# Patient Record
Sex: Female | Born: 1970
Health system: Southern US, Community
[De-identification: ages and names within clinical notes are randomized; demographics above are authoritative.]

## PROBLEM LIST (undated history)

## (undated) DIAGNOSIS — D649 Anemia, unspecified: Secondary | ICD-10-CM

## (undated) DIAGNOSIS — R7989 Other specified abnormal findings of blood chemistry: Secondary | ICD-10-CM

## (undated) DIAGNOSIS — J189 Pneumonia, unspecified organism: Secondary | ICD-10-CM

## (undated) DIAGNOSIS — N979 Female infertility, unspecified: Secondary | ICD-10-CM

## (undated) DIAGNOSIS — O3410 Maternal care for benign tumor of corpus uteri, unspecified trimester: Secondary | ICD-10-CM

## (undated) DIAGNOSIS — D259 Leiomyoma of uterus, unspecified: Secondary | ICD-10-CM

## (undated) DIAGNOSIS — Z9884 Bariatric surgery status: Secondary | ICD-10-CM

## (undated) HISTORY — PX: FINGER SURGERY: SHX640

## (undated) HISTORY — PX: TONSILECTOMY, ADENOIDECTOMY, BILATERAL MYRINGOTOMY AND TUBES: SHX2538

## (undated) HISTORY — DX: Other specified abnormal findings of blood chemistry: R79.89

## (undated) HISTORY — PX: TONSILLECTOMY: SUR1361

## (undated) HISTORY — PX: FOOT SURGERY: SHX648

## (undated) HISTORY — PX: DILATION AND CURETTAGE OF UTERUS: SHX78

---

## 1998-04-08 ENCOUNTER — Emergency Department (HOSPITAL_COMMUNITY): Admission: EM | Admit: 1998-04-08 | Discharge: 1998-04-08 | Payer: Self-pay | Admitting: Internal Medicine

## 1998-04-20 ENCOUNTER — Emergency Department (HOSPITAL_COMMUNITY): Admission: EM | Admit: 1998-04-20 | Discharge: 1998-04-20 | Payer: Self-pay

## 1998-11-01 ENCOUNTER — Emergency Department (HOSPITAL_COMMUNITY): Admission: EM | Admit: 1998-11-01 | Discharge: 1998-11-01 | Payer: Self-pay | Admitting: Emergency Medicine

## 1999-04-20 ENCOUNTER — Emergency Department (HOSPITAL_COMMUNITY): Admission: EM | Admit: 1999-04-20 | Discharge: 1999-04-20 | Payer: Self-pay | Admitting: Emergency Medicine

## 1999-08-05 ENCOUNTER — Emergency Department (HOSPITAL_COMMUNITY): Admission: EM | Admit: 1999-08-05 | Discharge: 1999-08-05 | Payer: Self-pay | Admitting: Emergency Medicine

## 1999-11-10 ENCOUNTER — Emergency Department (HOSPITAL_COMMUNITY): Admission: EM | Admit: 1999-11-10 | Discharge: 1999-11-10 | Payer: Self-pay | Admitting: Emergency Medicine

## 1999-11-12 ENCOUNTER — Emergency Department (HOSPITAL_COMMUNITY): Admission: EM | Admit: 1999-11-12 | Discharge: 1999-11-12 | Payer: Self-pay | Admitting: Emergency Medicine

## 2000-02-17 ENCOUNTER — Ambulatory Visit (HOSPITAL_BASED_OUTPATIENT_CLINIC_OR_DEPARTMENT_OTHER): Admission: RE | Admit: 2000-02-17 | Discharge: 2000-02-18 | Payer: Self-pay | Admitting: Otolaryngology

## 2000-02-17 ENCOUNTER — Encounter (INDEPENDENT_AMBULATORY_CARE_PROVIDER_SITE_OTHER): Payer: Self-pay | Admitting: Specialist

## 2000-06-29 ENCOUNTER — Encounter: Payer: Self-pay | Admitting: Emergency Medicine

## 2000-06-29 ENCOUNTER — Emergency Department (HOSPITAL_COMMUNITY): Admission: EM | Admit: 2000-06-29 | Discharge: 2000-06-29 | Payer: Self-pay | Admitting: Emergency Medicine

## 2000-07-11 ENCOUNTER — Emergency Department (HOSPITAL_COMMUNITY): Admission: EM | Admit: 2000-07-11 | Discharge: 2000-07-11 | Payer: Self-pay | Admitting: Emergency Medicine

## 2000-07-14 ENCOUNTER — Ambulatory Visit (HOSPITAL_BASED_OUTPATIENT_CLINIC_OR_DEPARTMENT_OTHER): Admission: RE | Admit: 2000-07-14 | Discharge: 2000-07-14 | Payer: Self-pay | Admitting: Orthopedic Surgery

## 2000-09-09 ENCOUNTER — Other Ambulatory Visit: Admission: RE | Admit: 2000-09-09 | Discharge: 2000-09-09 | Payer: Self-pay | Admitting: Gynecology

## 2001-07-25 ENCOUNTER — Encounter: Payer: Self-pay | Admitting: Emergency Medicine

## 2001-07-25 ENCOUNTER — Emergency Department (HOSPITAL_COMMUNITY): Admission: EM | Admit: 2001-07-25 | Discharge: 2001-07-25 | Payer: Self-pay | Admitting: Emergency Medicine

## 2001-12-14 ENCOUNTER — Encounter: Payer: Self-pay | Admitting: Emergency Medicine

## 2001-12-14 ENCOUNTER — Emergency Department (HOSPITAL_COMMUNITY): Admission: EM | Admit: 2001-12-14 | Discharge: 2001-12-14 | Payer: Self-pay | Admitting: *Deleted

## 2002-03-15 ENCOUNTER — Emergency Department (HOSPITAL_COMMUNITY): Admission: EM | Admit: 2002-03-15 | Discharge: 2002-03-15 | Payer: Self-pay | Admitting: Emergency Medicine

## 2002-03-15 ENCOUNTER — Encounter: Payer: Self-pay | Admitting: Emergency Medicine

## 2002-04-25 ENCOUNTER — Emergency Department (HOSPITAL_COMMUNITY): Admission: EM | Admit: 2002-04-25 | Discharge: 2002-04-25 | Payer: Self-pay | Admitting: Emergency Medicine

## 2002-04-25 ENCOUNTER — Encounter: Payer: Self-pay | Admitting: Emergency Medicine

## 2003-05-01 ENCOUNTER — Other Ambulatory Visit: Admission: RE | Admit: 2003-05-01 | Discharge: 2003-05-01 | Payer: Self-pay | Admitting: Gynecology

## 2003-09-26 ENCOUNTER — Emergency Department (HOSPITAL_COMMUNITY): Admission: EM | Admit: 2003-09-26 | Discharge: 2003-09-26 | Payer: Self-pay

## 2003-11-29 ENCOUNTER — Emergency Department (HOSPITAL_COMMUNITY): Admission: EM | Admit: 2003-11-29 | Discharge: 2003-11-29 | Payer: Self-pay | Admitting: Emergency Medicine

## 2004-01-04 ENCOUNTER — Emergency Department (HOSPITAL_COMMUNITY): Admission: EM | Admit: 2004-01-04 | Discharge: 2004-01-05 | Payer: Self-pay | Admitting: Emergency Medicine

## 2004-12-06 ENCOUNTER — Emergency Department (HOSPITAL_COMMUNITY): Admission: EM | Admit: 2004-12-06 | Discharge: 2004-12-06 | Payer: Self-pay | Admitting: Emergency Medicine

## 2005-02-04 ENCOUNTER — Other Ambulatory Visit: Admission: RE | Admit: 2005-02-04 | Discharge: 2005-02-04 | Payer: Self-pay | Admitting: Gynecology

## 2005-07-23 ENCOUNTER — Emergency Department (HOSPITAL_COMMUNITY): Admission: EM | Admit: 2005-07-23 | Discharge: 2005-07-23 | Payer: Self-pay | Admitting: Emergency Medicine

## 2005-07-26 ENCOUNTER — Emergency Department (HOSPITAL_COMMUNITY): Admission: EM | Admit: 2005-07-26 | Discharge: 2005-07-26 | Payer: Self-pay | Admitting: Emergency Medicine

## 2006-07-29 ENCOUNTER — Other Ambulatory Visit: Admission: RE | Admit: 2006-07-29 | Discharge: 2006-07-29 | Payer: Self-pay | Admitting: Gynecology

## 2006-09-23 ENCOUNTER — Encounter: Admission: RE | Admit: 2006-09-23 | Discharge: 2006-09-23 | Payer: Self-pay | Admitting: Gynecology

## 2006-09-27 ENCOUNTER — Ambulatory Visit (HOSPITAL_COMMUNITY): Admission: RE | Admit: 2006-09-27 | Discharge: 2006-09-27 | Payer: Self-pay | Admitting: Surgery

## 2006-09-29 ENCOUNTER — Ambulatory Visit (HOSPITAL_COMMUNITY): Admission: RE | Admit: 2006-09-29 | Discharge: 2006-09-29 | Payer: Self-pay | Admitting: Surgery

## 2006-10-05 ENCOUNTER — Encounter: Admission: RE | Admit: 2006-10-05 | Discharge: 2007-01-03 | Payer: Self-pay | Admitting: Surgery

## 2007-03-04 ENCOUNTER — Encounter (INDEPENDENT_AMBULATORY_CARE_PROVIDER_SITE_OTHER): Payer: Self-pay | Admitting: Gynecology

## 2007-03-04 ENCOUNTER — Ambulatory Visit (HOSPITAL_BASED_OUTPATIENT_CLINIC_OR_DEPARTMENT_OTHER): Admission: RE | Admit: 2007-03-04 | Discharge: 2007-03-04 | Payer: Self-pay | Admitting: Gynecology

## 2007-07-11 ENCOUNTER — Encounter: Admission: RE | Admit: 2007-07-11 | Discharge: 2007-10-09 | Payer: Self-pay | Admitting: Surgery

## 2007-07-21 HISTORY — PX: MYOMECTOMY ABDOMINAL APPROACH: SUR870

## 2007-07-24 DIAGNOSIS — Z9884 Bariatric surgery status: Secondary | ICD-10-CM | POA: Insufficient documentation

## 2007-07-24 HISTORY — PX: LAPAROSCOPIC GASTRIC BANDING: SHX1100

## 2007-07-24 HISTORY — DX: Bariatric surgery status: Z98.84

## 2007-07-25 ENCOUNTER — Ambulatory Visit (HOSPITAL_COMMUNITY): Admission: RE | Admit: 2007-07-25 | Discharge: 2007-07-26 | Payer: Self-pay | Admitting: Surgery

## 2007-11-10 ENCOUNTER — Encounter: Admission: RE | Admit: 2007-11-10 | Discharge: 2008-02-08 | Payer: Self-pay | Admitting: Surgery

## 2007-12-01 ENCOUNTER — Encounter: Admission: RE | Admit: 2007-12-01 | Discharge: 2007-12-01 | Payer: Self-pay | Admitting: Gynecology

## 2007-12-06 ENCOUNTER — Emergency Department (HOSPITAL_COMMUNITY): Admission: EM | Admit: 2007-12-06 | Discharge: 2007-12-06 | Payer: Self-pay | Admitting: Emergency Medicine

## 2008-05-04 ENCOUNTER — Encounter: Admission: RE | Admit: 2008-05-04 | Discharge: 2008-05-04 | Payer: Self-pay | Admitting: Surgery

## 2009-05-31 IMAGING — MG MM DIGITAL SCREENING BILAT W/ CAD
8 series · 8 of 8 positions shown · non-contrast
Comparison: none

DG SCREEN MAMMOGRAM BILATERAL
Bilateral CC and MLO view(s) were taken.

DIGITAL SCREENING MAMMOGRAM WITH CAD:
The breast tissue is almost entirely fatty.  No masses or malignant type calcifications are 
identified.  Compared with prior studies.

[R CC (1 of 2)]
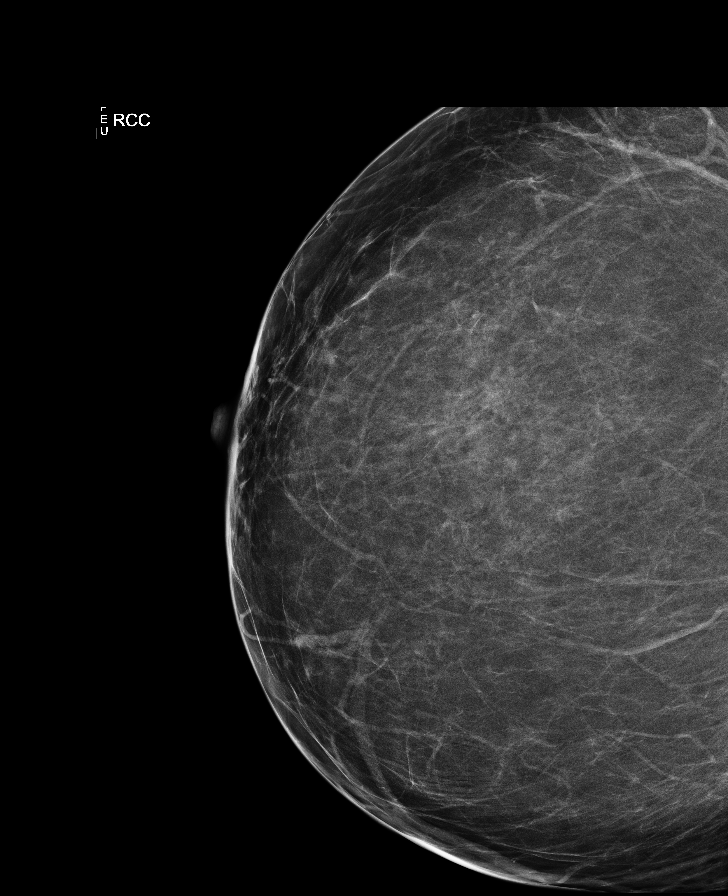

[L CC (1 of 2)]
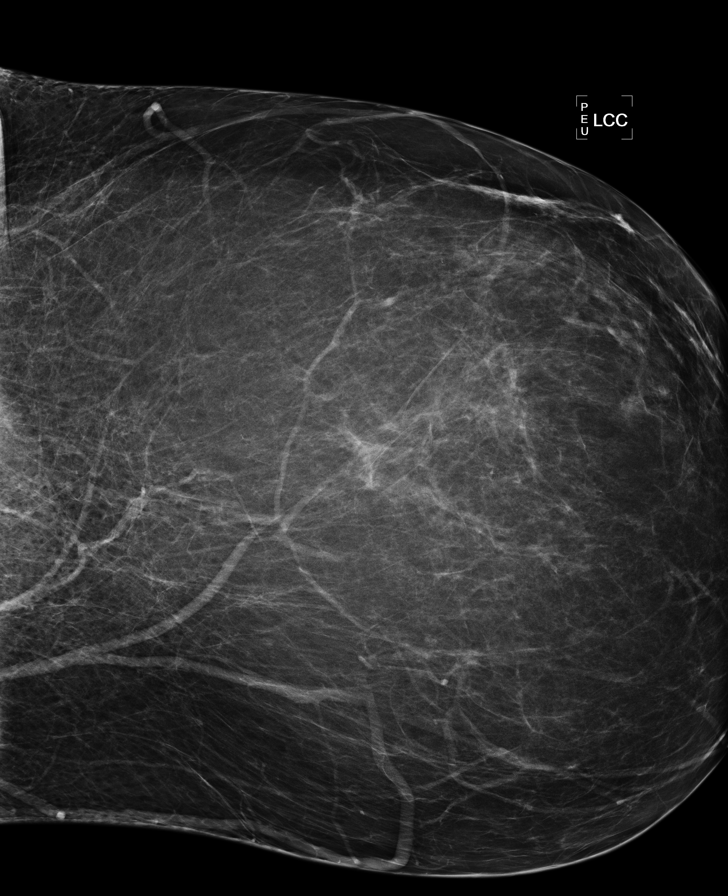

[L MLO (1 of 2)]
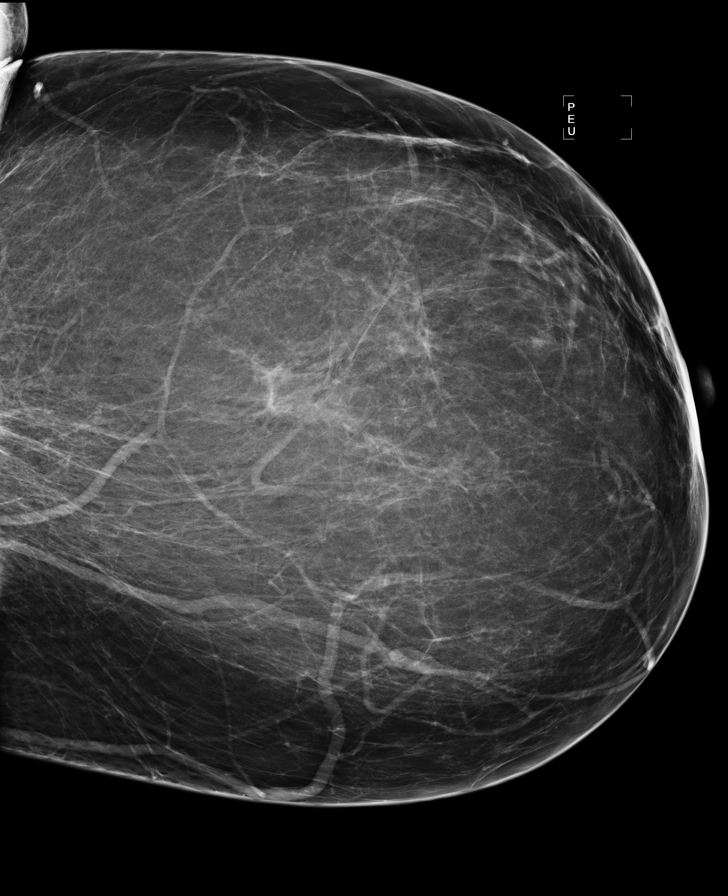

[R MLO (1 of 2)]
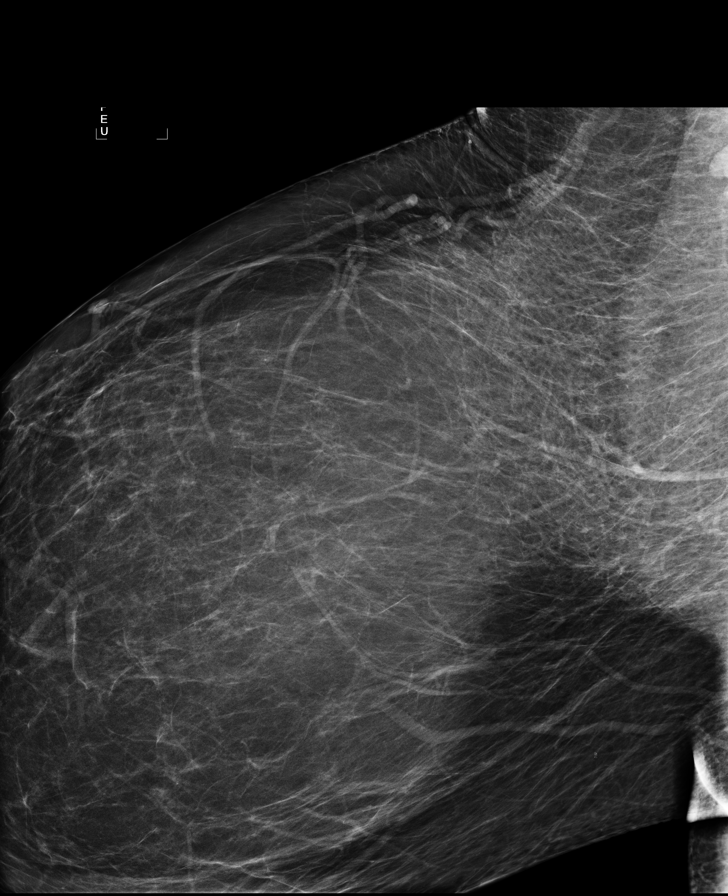

[R MLO (2 of 2)]
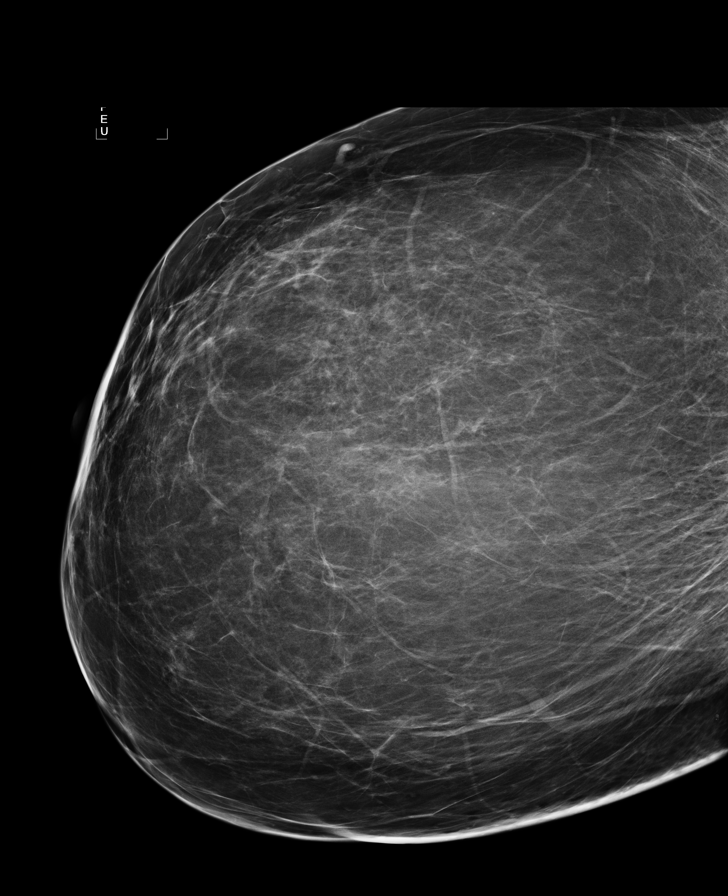

[R CC (2 of 2)]
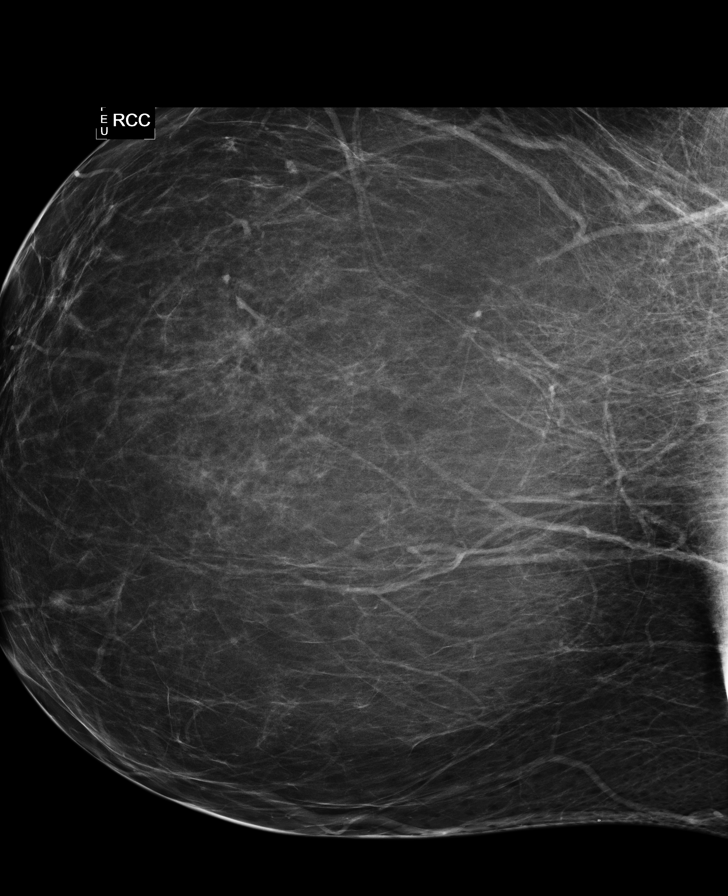

[L CC (2 of 2)]
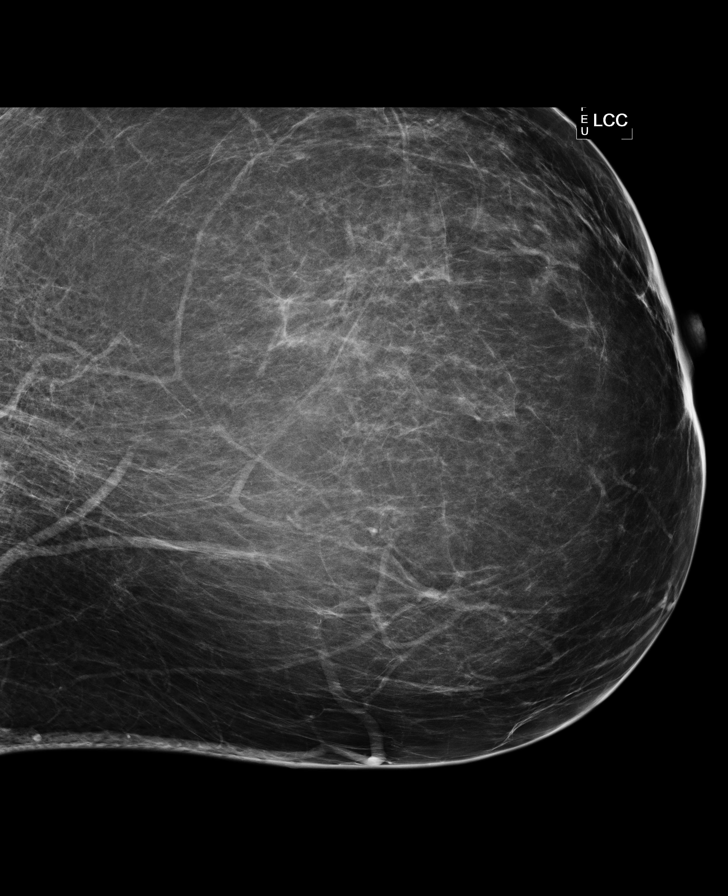

[L MLO (2 of 2)]
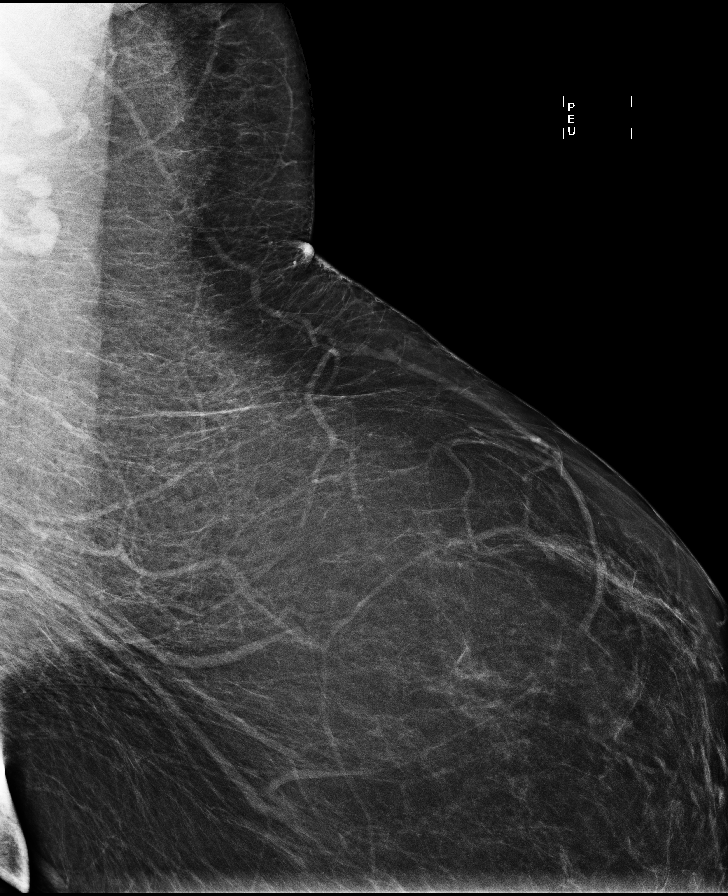

[8 of 8 positions shown; findings below may reference images not displayed]

IMPRESSION: No specific mammographic evidence of malignancy.  Next screening mammogram is recommended in one 
year.

ASSESSMENT: Negative - BI-RADS 1

Screening mammogram in 1 year.
ANALYZED BY COMPUTER AIDED DETECTION. , THIS PROCEDURE WAS A DIGITAL MAMMOGRAM.

## 2009-07-20 HISTORY — PX: OTHER SURGICAL HISTORY: SHX169

## 2010-02-28 ENCOUNTER — Encounter: Admission: RE | Admit: 2010-02-28 | Discharge: 2010-02-28 | Payer: Self-pay | Admitting: Gynecology

## 2010-08-10 ENCOUNTER — Encounter: Payer: Self-pay | Admitting: Gynecology

## 2010-12-02 NOTE — Op Note (Signed)
Michelle Serrano, Michelle Serrano                ACCOUNT NO.:  [id number]   MEDICAL RECORD NO.:  [id number]          PATIENT TYPE:  AMB   LOCATION:  [facility]                         FACILITY:  [facility]   PHYSICIAN:  Gretta Cool, M.D. DATE OF BIRTH:  11/21/1970   DATE OF PROCEDURE:  DATE OF DISCHARGE:                               OPERATIVE REPORT   PREOPERATIVE DIAGNOSIS:  Bilateral complex adnexal structures suspicious  of endometriosis with elevation of CA125, minimal, versus bilateral tubo-  ovarian complexes versus possible ovarian neoplasia.   POSTOPERATIVE DIAGNOSIS:  Extreme adhesion of the bowel, uterus and  adnexal structures probably secondary to previous pelvic inflammatory  disease with bilateral tubo-ovarian complexes, difficult to rule out  ovarian neoplasia, benign.   PROCEDURE:  1. Diagnostic laparoscopy.  2. Pelvic irrigation.  3. Attempted lysis of adhesions and definitive evaluation abandoned      because of the extent of adhesions, lack of bowel prep and so      forth.   SURGEON:  Beather Arbour, MD   ANESTHESIA:  General orotracheal.   BRIEF HISTORY:  Michelle Serrano is a 40 year old nulligravida with extreme  morbid obesity with BMI approaching 50.  She has a long history of  infertility and unprotected intercourse with her previous husband and  present husband; he has had a negative workup.  She has had CT and  repeated ultrasound evaluation of complex adnexal structures, both left  and right.  The complexity varies back and forth from left to right with  evidence of complex suspicious of endometriosis versus tubo-ovarian  complex.  There is a remote possibility of ovarian neoplasia.  She now  presents for laparoscopy to rule out ovarian neoplasia and for  definitive treatment of ovarian endometriosis if present.  She has had  minimal elevations of CA125 ranging from 35-43.  She is now admitted for  diagnostic laparoscopy evaluation.   DESCRIPTION OF PROCEDURE:  Under  excellent general orotracheal  anesthesia, with the patient prepped and draped in Allen stirrups, with  Hulka tenacula applied to her cervix and bladder drained, a subumbilical  incision was made and the Optiview system used to enter the abdominal  cavity so as to prevent injury.  The bariatric ports were used.  After  the peritoneal cavity was entered and proper placement assured, the  peritoneal cavity was filled with carbon dioxide.  The cavity was then  examined and photographed.  A large complex right adnexal structure was  identified that appeared to be a tubo-ovarian complex densely adherent  to the back of the uterus and to the lateral pelvic wall; it also was  adherent to the cul-de-sac peritoneum.  The adhesions seemed to be  limited to the large bowel; there was no evidence of small bowel  adhesion.  The large bowel appeared to be adherent over the left ovary  and to the right posterior aspect of the uterus and to the right tubo-  ovarian complex.  The left adnexal structures were never visualized.  Attempt was made to mobilize the uterus and adnexal structures and the  bowel from these structures, but  the adhesions were felt to be  significantly dense of such intensity that there was considerable risk  of injury.  At this point, with no evidence of endometriosis and strong  suspicion confirmed of pelvic inflammatory  process, decision was made to terminate the outpatient procedure and  return for definitive therapy at a later time with bowel prep and  adequate perforation in the inpatient setting.  With a length of time of  followup, I think the possibility of a neoplasm or anaplastic process is  virtually nonexistent.           ______________________________  Gretta Cool, M.D.     CWL/MEDQ  D:  03/04/2007  T:  03/05/2007  Job:  [id number]

## 2010-12-02 NOTE — Op Note (Signed)
Michelle Serrano, Michelle Serrano                ACCOUNT NO.:  [id number]   MEDICAL RECORD NO.:  [id number]          PATIENT TYPE:  AMB   LOCATION:  DAY                          FACILITY:  [facility]   PHYSICIAN:  Sandria Bales. Ezzard Standing, M.D.  DATE OF BIRTH:  03-May-1971   DATE OF PROCEDURE:  07/25/2007  DATE OF DISCHARGE:                               OPERATIVE REPORT   PREOPERATIVE DIAGNOSIS:  Morbid obesity with a weight of 313 and body  mass index of 49.2.   POSTOPERATIVE DIAGNOSIS:  Morbid obesity with a weight of 313 and body  mass index of 49.2.   PROCEDURE:  Laparoscopic band, AP standard band.   INDICATIONS FOR PROCEDURE:  Michelle Serrano is a 40 year old black female who  is a patient of Dr. Esperanza Richters who has been through our preoperative  bariatric program for morbid obesity.  Her weight is 313, her BMI 49.23,  and she is interested a laparoscopic band procedure.   The indications and potential complications were explained to the  patient.  The potential complications include, but are not limited to,  bleeding, infection, bowel injury, slippage of the band, erosion of the  band, and long-term nutritional consequences.   The patient now comes to the operating room for surgery.   OPERATIVE NOTE:  A timeout was held at the initiation of the procedure,  identifying the patient and procedure.  She was given a gram of Ancef at  the initiation of the procedure.  Her abdomen was prepped with Betadine  solution, sterilely draped, and PAS stockings were in place.   I accessed her abdominal cavity with an 11 mm Ethicon OptiView in the  left upper quadrant and accessed the abdominal cavity.  I then placed  five additional trocars, a 5 mm subxiphoid trocar, a 15 mm right  subcostal, an 11 mm right paramedian, an 11 mm left paramedian, and a 5  mm left lateral subcostal trocar.   Abdominal exploration carried out, identifying right and left lobes of  the liver unremarkable.  The stomach was unremarkable.   The bowel of  what I could see was unremarkable, much of this covered with omental  fat.   I then turn my attention to the proximal stomach.  I placed the  [facility] liver retractor under the lateral segment of the left lobe of  the liver, identified the angle of Hiss, and made an incision on the  left side of the esophagogastric junction.  I then made an incision  through the gastrohepatic ligament and identified the right crus.   I then passed the finger dissector behind the esophagogastric junction.   I then used the AP standard lap band and placed this around the proximal  stomach.  Anesthesia then passed a sizing balloon down into the stomach  and cinched the lap band down over this sizer, and it was not too tight.   I then imbricated the stomach over the band using 2-0 Ethibond sutures  sewing the stomach  to stomach.  I used three separate sutures and used  tie-knots on the sutures.  The band  then lay up on a a good angle.  It  was imbricated appropriately without too much tension.  Photos were  taken of the band.  The tubing the goes to the band was then brought out  through the right lateral trocar.  The liver retractor was removed and  the abdomen desufflated.  The trocars were removed in turn.   I then extended my incision in the right mid abdomen.  I attached the  tubing to the band and sewed the band in place with 0-Prolene suture.  The band lay flat.  The tubing went straight into the abdominal cavity.  I then irrigated the wound, closed the subcutaneous tissue with 2-0  Vicryl suture.  The skin was closed with 5-0 Vicryl and 5-0 Monocryl  suture, painted with tincture of Benzoin and Steri-Strips.   The patient tolerated the procedure well and was transported to the  recovery room in good condition.  Sponge and needle counts were correct  at the end of the case.      Sandria Bales. Ezzard Standing, M.D.  Electronically Signed     DHN/MEDQ  D:  07/25/2007  T:  07/25/2007   Job:  [id number]   cc:   Michelle Serrano, M.D.  Fax: [phone]

## 2010-12-05 NOTE — Op Note (Signed)
[place]. [facility]  Patient:    Michelle Serrano                     MRN: [id number] Proc. Date: 02/17/00 Adm. Date:  [id number] Attending:  Fernande Boyden CC:         Zenaida Niece, M.D.   Operative Report  PREOPERATIVE DIAGNOSIS:  Recurrent streptococcal tonsillitis.  POSTOPERATIVE DIAGNOSIS:  Recurrent streptococcal tonsillitis.  PROCEDURE PERFORMED:  Tonsillectomy.  SURGEON:  Gloris Manchester. Lazarus Salines, M.D.  ANESTHESIA:  General orotracheal.  BLOOD LOSS:  Minimal.  COMPLICATIONS:  None.  FINDINGS:  Two plus embedded, heavily fibrosed tonsils.  Minimal residual adenoids.  Normal soft palpate.  PROCEDURE:  With the patient in a comfort supine position, general orotracheal anesthesia was induced without difficulty.  At an appropriate level, the table was turned 90 degrees with patient placed in Trendelenburg.  A clean preparation and draping was accomplished.  Taking care to protect lips, teeth and endotracheal tube, the Crowe-Davis mouthgag was introduced, expanded for visualization, and suspended from the Mayo stand in the standard fashion.  The findings were as described above.  Palate retractor and mirror were used to visualize the nasopharynx with the findings as described above.  Finally, nasal speculum was used to examine the anterior nose with findings of congested turbinates.  Election was made to proceed with tonsillectomy only.  Xylocaine 0.5% with 1:100,000 epinephrine, 8 cc total was infiltrated into the peritonsillar planes on both sides for intraoperative hemostasis.  Several minutes were allowed for this to take affect.  Beginning on the left side, the tonsil was grasped and retracted medially. The mucosa overlying the anterior and superior poles were coagulated and then cut down to the capsule of the tonsil.  Using the cautery tip as a blunt dissector, but mostly lysing fibrous bands and coagulating crossing vessels  as identified, the tonsil was dissected free with muscular fossa from superiorly downward.  The tonsil was removed in its entirety as determined by examination of both tonsil and fossa.  A small additional quantity of cautery rendered the fossa hemostatic.  After completing the left tonsillectomy, the right side was done in an identical fashion.  After rendering both tonsil fossae hemostatic, the mouthgag was relaxed for several minutes.  Upon reexpansion, hemostasis was persistent.  At this point, the procedure was completed.  The mouthgag was relaxed and removed.  The dental status was intact.  Patient was returned to anesthesia, awakened, extubated and transferred to recovery in stable condition.  COMMENT:  A 40 year old black female with a history of multiple episodes of recurrent streptococcal tonsillitis was the indication for todays procedure. Anticipated routine postoperative recovery with attention to analgesia, antibiosis, hydration, and observation for bleeding, emesis, or airway compromise. DD:  02/17/00 TD:  02/17/00 Job: [id number]

## 2010-12-05 NOTE — Op Note (Signed)
[place]  Patient:    Michelle Serrano, Michelle Serrano                    MRN: [id number] Proc. Date: 07/14/00 Adm. Date:  [id number] Disc. Date: [id number] Attending:  Harrel Carina CC:         Artist Pais Weingold, M.D. x 2                           Operative Report  PREOPERATIVE DIAGNOSIS:  Displaced right fifth proximal phalangeal fracture.  POSTOPERATIVE DIAGNOSIS:  Displaced right fifth proximal phalangeal fracture.  PROCEDURE:  Open reduction and internal fixation of above fracture using minifragment lag screws 1.5 mm x 8 x 8 x 9.  SURGEON:  Artist Pais. Mina Marble, M.D.  ASSISTANT:  Junius Roads. Ireton, P.A.C.  ANESTHESIA:  General anesthesia.  TOURNIQUET TIME:  45 minutes.  COMPLICATIONS:  None.  DRAINS:  None.  DESCRIPTION OF PROCEDURE:  The patient was taken to the operating room after the induction of adequate general anesthesia.  The right upper extremity was prepped and draped in the usual sterile fashion.  An Esmarch was used to exsanguinate the limb.  Tourniquet was inflated to 250 mmHg.  At this point in time a C-shaped incision was made over the dorsal aspect of the right fifth digit and a large radial based flap was elevated.  Once this was done, the extensor mechanism overlying the proximal phalanx was identified.  It was split with a 15 blade longitudinally, thus exposing the underlying periostium. The periostium was split longitudinally thus exposing a long spiral oblique fracture of the proximal phalanx which was displaced and angulated.  The fracture fragments were manipulated.  The fracture was opened.  Clot and nonviable material was debrided using a half curet and irrigation.  Once this was done, reduction was performed.  Provisional fixation was carried out using a 4/5 K-wire and intraoperative x-ray showed good alignment in both the AP and lateral plane.  Once this was done, three minifragment screws were used to fix the  fracture.  Two were 9 mm and one 8 mm x 1.5 mm which were screw tapped in place in lag fashion sequentially.  Intraoperative x-ray showed good reduction in both AP, lateral, and oblique views.  The wound was then thoroughly irrigated.  The periostium was closed using 4-0 Vicryl followed by repair of the tendon using a running and locking 4-0 Mersilene epitendoness stitch followed by closure of the skin with 5-0 nylon.  The patient had a Marcaine block for postoperative pain relief and was dressed in Xeroform, 4 x 4s, fluffs, and a functional splint.  The patient tolerated the procedure well and went to the recovery room in stable condition. DD:  07/14/00 TD:  07/14/00 Job: [id number]

## 2010-12-22 ENCOUNTER — Emergency Department (HOSPITAL_BASED_OUTPATIENT_CLINIC_OR_DEPARTMENT_OTHER)
Admission: EM | Admit: 2010-12-22 | Discharge: 2010-12-22 | Disposition: A | Discharge status: Home / Self Care | Payer: BC Managed Care – PPO | Attending: Emergency Medicine | Admitting: Emergency Medicine

## 2010-12-22 DIAGNOSIS — J40 Bronchitis, not specified as acute or chronic: Secondary | ICD-10-CM | POA: Insufficient documentation

## 2010-12-22 DIAGNOSIS — R05 Cough: Secondary | ICD-10-CM | POA: Insufficient documentation

## 2010-12-22 DIAGNOSIS — R059 Cough, unspecified: Secondary | ICD-10-CM | POA: Insufficient documentation

## 2011-04-09 LAB — CBC
HCT: 30.7 — ABNORMAL LOW
MCV: 78

## 2011-04-09 LAB — DIFFERENTIAL
Basophils Absolute: 0
Basophils Relative: 0
Eosinophils Relative: 0
Lymphocytes Relative: 28
Lymphs Abs: 2.2
Neutro Abs: 5.3
Neutrophils Relative %: 66

## 2011-04-15 LAB — COMPREHENSIVE METABOLIC PANEL
Albumin: 3.1 — ABNORMAL LOW
BUN: 3 — ABNORMAL LOW
CO2: 25
Calcium: 8.5
Chloride: 101
GFR calc Af Amer: >60
Glucose, Bld: 103 — ABNORMAL HIGH
Potassium: 3.3 — ABNORMAL LOW
Total Protein: 6.8

## 2011-04-15 LAB — WET PREP, GENITAL
Clue Cells Wet Prep HPF POC: NONE SEEN
Trich, Wet Prep: NONE SEEN
Yeast Wet Prep HPF POC: NONE SEEN

## 2011-04-15 LAB — URINE MICROSCOPIC-ADD ON

## 2011-04-15 LAB — URINALYSIS, ROUTINE W REFLEX MICROSCOPIC
Glucose, UA: NEGATIVE
Nitrite: NEGATIVE
Protein, ur: NEGATIVE
Specific Gravity, Urine: 1.026

## 2011-04-15 LAB — DIFFERENTIAL
Basophils Absolute: 0
Lymphs Abs: 1.5
Monocytes Absolute: 0.5
Monocytes Relative: 7
Neutro Abs: 5.4
Neutrophils Relative %: 72

## 2011-04-15 LAB — CBC
MCHC: 33.1
RDW: 15

## 2011-04-15 LAB — POCT PREGNANCY, URINE: Operator id: 29727

## 2011-04-15 LAB — RPR: RPR Ser Ql: NONREACTIVE

## 2011-04-24 LAB — BASIC METABOLIC PANEL
BUN: 7
Calcium: 9.2
Creatinine, Ser: 0.6
GFR calc non Af Amer: >60
Potassium: 4.9

## 2011-04-24 LAB — HEMOGLOBIN AND HEMATOCRIT, BLOOD: Hemoglobin: 11.6 — ABNORMAL LOW

## 2011-10-07 ENCOUNTER — Encounter (HOSPITAL_COMMUNITY): Payer: Self-pay | Admitting: *Deleted

## 2011-10-07 ENCOUNTER — Inpatient Hospital Stay (HOSPITAL_COMMUNITY)
Admission: AD | Admit: 2011-10-07 | Discharge: 2011-10-07 | Disposition: A | Discharge status: Home / Self Care | Payer: BC Managed Care – PPO | Source: Ambulatory Visit | Attending: Obstetrics and Gynecology | Admitting: Obstetrics and Gynecology

## 2011-10-07 DIAGNOSIS — N949 Unspecified condition associated with female genital organs and menstrual cycle: Secondary | ICD-10-CM | POA: Insufficient documentation

## 2011-10-07 DIAGNOSIS — R1031 Right lower quadrant pain: Secondary | ICD-10-CM | POA: Insufficient documentation

## 2011-10-07 HISTORY — DX: Bariatric surgery status: Z98.84

## 2011-10-07 HISTORY — DX: Female infertility, unspecified: N97.9

## 2011-10-07 HISTORY — DX: Anemia, unspecified: D64.9

## 2011-10-07 HISTORY — DX: Leiomyoma of uterus, unspecified: O34.10

## 2011-10-07 HISTORY — DX: Leiomyoma of uterus, unspecified: D25.9

## 2011-10-07 LAB — URINALYSIS, ROUTINE W REFLEX MICROSCOPIC
Glucose, UA: NEGATIVE mg/dL
Hgb urine dipstick: NEGATIVE
Specific Gravity, Urine: >1.03 — ABNORMAL HIGH (ref 1.005–1.030)
pH: 5.5 (ref 5.0–8.0)

## 2011-10-07 MED ORDER — TRAMADOL HCL 50 MG PO TABS: 50.0000 mg | ORAL_TABLET | Freq: Four times a day (QID) | ORAL | Status: AC | PRN

## 2011-10-07 NOTE — MAU Note (Signed)
Pt LMP 3/14, G2 P0, having RLQ pain x 1 wk.  This am noticed slight swelling on the right side and a knot in the groin area on the right side.

## 2011-10-07 NOTE — MAU Provider Note (Signed)
  History   RLQ pain  X one week  CSN: [id number]  Arrival date and time: 10/07/11 8295   First Provider Initiated Contact with Patient 10/07/11 2111      Chief Complaint  Patient presents with  . Abdominal Pain   HPI  OB History    Grav Para Term Preterm Abortions TAB SAB Ect Mult Living   2    2  2    0      Past Medical History  Diagnosis Date  . Uterine fibroids affecting pregnancy   . LAP-BAND surgery status 07-24-2007  . Anemia   . Infertility, female   . Asthma     Past Surgical History  Procedure Date  . Scar tissue removal in uterus 2011  . Laparoscopic gastric banding 07-24-2007  . Finger surgery     rt little finger  . Tonsilectomy, adenoidectomy, bilateral myringotomy and tubes     Family History  Problem Relation Age of Onset  . Anesthesia problems Neg Hx     History  Substance Use Topics  . Smoking status: Never Smoker   . Smokeless tobacco: Never Used  . Alcohol Use: No    Allergies:  Allergies  Allergen Reactions  . Aspirin Hives  . Latex Hives  . Other Other (See Comments)    Patient has been allergy tested and there are too many food allergies for me to list She does have an Epipen  . Shellfish Allergy Hives    Prescriptions prior to admission  Medication Sig Dispense Refill  . fexofenadine (ALLEGRA) 60 MG tablet Take 60 mg by mouth daily.      Marland Kitchen levocetirizine (XYZAL) 2.5 MG/5ML solution Take 2.5 mg by mouth 2 (two) times daily.        ROS Physical Exam dictated   Blood pressure 145/86, pulse 82, temperature 98.5 F (36.9 C), temperature source Oral, resp. rate 16, height 5\' 7"  (1.702 m), weight 105.144 kg (231 lb 12.8 oz), last menstrual period 10/01/2011.  Physical Exam  MAU Course  Procedures  MDM na  Assessment and Plan  RLQ pain - nl exam DC home  Note dictated  Wyatt Thorstenson J 10/07/2011, 9:14 PM

## 2011-10-07 NOTE — Discharge Instructions (Signed)

## 2011-10-08 LAB — POCT PREGNANCY, URINE: Preg Test, Ur: NEGATIVE

## 2011-10-08 NOTE — H&P (Signed)
NAMERYLYNNE, SCHICKER                ACCOUNT NO.:  [id number]  MEDICAL RECORD NO.:  [id number]  LOCATION:  [facility]                          FACILITY:  [facility]  PHYSICIAN:  Lenoard Aden, M.D.DATE OF BIRTH:  [birth year]  DATE OF ADMISSION:  10/07/2011 DATE OF DISCHARGE:  10/07/2011                             HISTORY & PHYSICAL   CHIEF COMPLAINT:  Right lower quadrant pain times a week.  She is a 41 year old African American female, G2, P0, with a history of myomectomy, who now presents with right-sided pain times a week.  ALLERGIES:  She has allergies to LATEX.  MEDICATIONS:  Vitamins.  SOCIAL HISTORY:  Noncontributory.  FAMILY HISTORY:  Remarkable for diabetes and breast cancer.  SURGICAL HISTORY:  Remarkable for laparoscopy, laparoscopic myomectomy and lap band surgery.  PHYSICAL EXAMINATION:  GENERAL:  She is a well-developed, well-nourished white female, in no acute distress. HEENT:  Normal. NECK:  Supple.  Full range of motion. LUNGS:  Clear. HEART:  Regular rate and rhythm. ABDOMEN:  Soft and nontender. PELVIC:  An irregularly shaped uterus with no adnexal masses. EXTREMITIES:  There are no cords. NEUROLOGIC:  Nonfocal. SKIN:  Intact.  IMPRESSION:  Pelvic pain of unknown etiology.  PLAN:  Tramadol given, follow up in the office as needed.  Scheduled pelvic ultrasound as needed.     Lenoard Aden, M.D.     RJT/MEDQ  D:  10/07/2011  T:  10/08/2011  Job:  [id number]

## 2012-07-20 DIAGNOSIS — J189 Pneumonia, unspecified organism: Secondary | ICD-10-CM

## 2012-07-20 HISTORY — DX: Pneumonia, unspecified organism: J18.9

## 2013-07-06 ENCOUNTER — Encounter (HOSPITAL_BASED_OUTPATIENT_CLINIC_OR_DEPARTMENT_OTHER): Payer: Self-pay | Admitting: Emergency Medicine

## 2013-07-06 ENCOUNTER — Emergency Department (HOSPITAL_BASED_OUTPATIENT_CLINIC_OR_DEPARTMENT_OTHER)
Admission: EM | Admit: 2013-07-06 | Discharge: 2013-07-06 | Disposition: A | Discharge status: Home / Self Care | Payer: BC Managed Care – PPO | Attending: Emergency Medicine | Admitting: Emergency Medicine

## 2013-07-06 DIAGNOSIS — J45909 Unspecified asthma, uncomplicated: Secondary | ICD-10-CM | POA: Insufficient documentation

## 2013-07-06 DIAGNOSIS — Z91040 Latex allergy status: Secondary | ICD-10-CM | POA: Insufficient documentation

## 2013-07-06 DIAGNOSIS — R5381 Other malaise: Secondary | ICD-10-CM | POA: Insufficient documentation

## 2013-07-06 DIAGNOSIS — Z862 Personal history of diseases of the blood and blood-forming organs and certain disorders involving the immune mechanism: Secondary | ICD-10-CM | POA: Insufficient documentation

## 2013-07-06 DIAGNOSIS — Z79899 Other long term (current) drug therapy: Secondary | ICD-10-CM | POA: Insufficient documentation

## 2013-07-06 DIAGNOSIS — R6883 Chills (without fever): Secondary | ICD-10-CM | POA: Insufficient documentation

## 2013-07-06 DIAGNOSIS — Z8742 Personal history of other diseases of the female genital tract: Secondary | ICD-10-CM | POA: Insufficient documentation

## 2013-07-06 DIAGNOSIS — R491 Aphonia: Secondary | ICD-10-CM | POA: Insufficient documentation

## 2013-07-06 DIAGNOSIS — J329 Chronic sinusitis, unspecified: Secondary | ICD-10-CM | POA: Insufficient documentation

## 2013-07-06 MED ORDER — AMOXICILLIN 500 MG PO CAPS: 1000.0000 mg | ORAL_CAPSULE | Freq: Two times a day (BID) | ORAL | Status: DC

## 2013-07-06 MED ORDER — HYDROCOD POLST-CHLORPHEN POLST 10-8 MG/5ML PO LQCR: 5.0000 mL | Freq: Every evening | ORAL | Status: DC | PRN

## 2013-07-06 NOTE — ED Notes (Signed)
Pt amb to room 7 with quick steady gait smiling in nad. Pt reports cough and congestion x Sunday with subjective fevers at night.

## 2013-07-06 NOTE — ED Provider Notes (Signed)
CSN: [id number]     Arrival date & time 07/06/13  1304 History   First MD Initiated Contact with Patient 07/06/13 1329     Chief Complaint  Patient presents with  . Cough  . Nasal Congestion   (Consider location/radiation/quality/duration/timing/severity/associated sxs/prior Treatment) Patient is a 42 y.o. female presenting with cough. The history is provided by the patient. No language interpreter was used.  Cough Cough characteristics:  Productive Severity:  Moderate Duration:  4 days Associated symptoms: myalgias and rhinorrhea   Associated symptoms: no fever   Associated symptoms comment:  Cough, worse at night and sometimes productive, nasal congestion and loss of voice for 4 days. She has had chills without known or measured temperature. No N, V. She reports frontal headache, worse with cough. No sick contacts.   Past Medical History  Diagnosis Date  . Uterine fibroids affecting pregnancy   . LAP-BAND surgery status 07-24-2007  . Anemia   . Infertility, female   . Asthma    Past Surgical History  Procedure Laterality Date  . Scar tissue removal in uterus  2011  . Laparoscopic gastric banding  07-24-2007  . Finger surgery      rt little finger  . Tonsilectomy, adenoidectomy, bilateral myringotomy and tubes     Family History  Problem Relation Age of Onset  . Anesthesia problems Neg Hx    History  Substance Use Topics  . Smoking status: Never Smoker   . Smokeless tobacco: Never Used  . Alcohol Use: No   OB History   Grav Para Term Preterm Abortions TAB SAB Ect Mult Living   2    2  2    0     Review of Systems  Constitutional: Negative for fever.  HENT: Positive for congestion, rhinorrhea, sinus pressure and voice change.   Respiratory: Positive for cough.   Gastrointestinal: Negative for nausea, vomiting and abdominal pain.  Genitourinary: Negative for dysuria.  Musculoskeletal: Positive for myalgias.    Allergies  Aspirin; Latex; Other; and Shellfish  allergy  Home Medications   Current Outpatient Rx  Name  Route  Sig  Dispense  Refill  . fexofenadine (ALLEGRA) 60 MG tablet   Oral   Take 60 mg by mouth daily.         Marland Kitchen levocetirizine (XYZAL) 2.5 MG/5ML solution   Oral   Take 2.5 mg by mouth 2 (two) times daily.          BP 157/90  Pulse 80  Temp(Src) 97.4 F (36.3 C) (Oral)  Resp 18  Ht 5\' 7"  (1.702 m)  Wt 238 lb (107.956 kg)  BMI 37.27 kg/m2  SpO2 100%  LMP 06/28/2013 Physical Exam  Constitutional: She appears well-developed and well-nourished.  HENT:  Head: Normocephalic.  Right Ear: External ear normal.  Left Ear: External ear normal.  Nose: Mucosal edema present. Right sinus exhibits frontal sinus tenderness. Left sinus exhibits frontal sinus tenderness.  Mouth/Throat: Oropharynx is clear and moist.  Neck: Normal range of motion. Neck supple.  Cardiovascular: Normal rate and normal heart sounds.   No murmur heard. Pulmonary/Chest: Effort normal and breath sounds normal. She has no wheezes. She has no rales.  Abdominal: Soft. Bowel sounds are normal. She exhibits no distension. There is no tenderness.  Musculoskeletal: Normal range of motion.  Lymphadenopathy:    She has no cervical adenopathy.  Skin: Skin is warm and dry. No pallor.    ED Course  Procedures (including critical care time) Labs Review Labs Reviewed -  No data to display Imaging Review No results found.  EKG Interpretation   None       MDM  No diagnosis found. 1. Sinusitis  Will treat with abx and encourage PCP follow up. Stable for discharge.     Arnoldo Hooker, PA-C 07/06/13 1414

## 2013-07-06 NOTE — ED Provider Notes (Signed)
Medical screening examination/treatment/procedure(s) were performed by non-physician practitioner and as supervising physician I was immediately available for consultation/collaboration.  EKG Interpretation   None         Harless Molinari M Tyreesha Maharaj, MD 07/06/13 1557 

## 2014-05-18 ENCOUNTER — Other Ambulatory Visit: Payer: Self-pay

## 2014-05-18 DIAGNOSIS — Z1231 Encounter for screening mammogram for malignant neoplasm of breast: Secondary | ICD-10-CM

## 2014-05-21 ENCOUNTER — Encounter (HOSPITAL_BASED_OUTPATIENT_CLINIC_OR_DEPARTMENT_OTHER): Payer: Self-pay | Admitting: Emergency Medicine

## 2014-06-04 ENCOUNTER — Ambulatory Visit: Payer: BC Managed Care – PPO

## 2014-06-18 ENCOUNTER — Ambulatory Visit
Admission: RE | Admit: 2014-06-18 | Discharge: 2014-06-18 | Disposition: A | Discharge status: Home / Self Care | Payer: 59 | Source: Ambulatory Visit

## 2014-06-18 DIAGNOSIS — Z1231 Encounter for screening mammogram for malignant neoplasm of breast: Secondary | ICD-10-CM

## 2015-08-01 ENCOUNTER — Other Ambulatory Visit: Payer: Self-pay

## 2015-08-01 DIAGNOSIS — Z1231 Encounter for screening mammogram for malignant neoplasm of breast: Secondary | ICD-10-CM

## 2015-08-13 ENCOUNTER — Ambulatory Visit
Admission: RE | Admit: 2015-08-13 | Discharge: 2015-08-13 | Disposition: A | Discharge status: Home / Self Care | Payer: Self-pay | Source: Ambulatory Visit

## 2015-08-13 DIAGNOSIS — Z1231 Encounter for screening mammogram for malignant neoplasm of breast: Secondary | ICD-10-CM

## 2016-08-26 ENCOUNTER — Other Ambulatory Visit: Payer: Self-pay | Admitting: Obstetrics and Gynecology

## 2016-08-26 DIAGNOSIS — Z1231 Encounter for screening mammogram for malignant neoplasm of breast: Secondary | ICD-10-CM

## 2016-09-07 ENCOUNTER — Ambulatory Visit (INDEPENDENT_AMBULATORY_CARE_PROVIDER_SITE_OTHER): Payer: 59 | Admitting: Gynecology

## 2016-09-07 ENCOUNTER — Other Ambulatory Visit: Payer: Self-pay | Admitting: Gynecology

## 2016-09-07 ENCOUNTER — Encounter: Payer: Self-pay | Admitting: Gynecology

## 2016-09-07 ENCOUNTER — Ambulatory Visit
Admission: RE | Admit: 2016-09-07 | Discharge: 2016-09-07 | Disposition: A | Discharge status: Home / Self Care | Payer: 59 | Source: Ambulatory Visit | Attending: Obstetrics and Gynecology | Admitting: Obstetrics and Gynecology

## 2016-09-07 VITALS — BP 124/74 | Ht 67.25 in | Wt 212.4 lb

## 2016-09-07 DIAGNOSIS — D259 Leiomyoma of uterus, unspecified: Secondary | ICD-10-CM

## 2016-09-07 DIAGNOSIS — Z30011 Encounter for initial prescription of contraceptive pills: Secondary | ICD-10-CM | POA: Diagnosis not present

## 2016-09-07 DIAGNOSIS — Z01419 Encounter for gynecological examination (general) (routine) without abnormal findings: Secondary | ICD-10-CM

## 2016-09-07 DIAGNOSIS — Z1231 Encounter for screening mammogram for malignant neoplasm of breast: Secondary | ICD-10-CM

## 2016-09-07 HISTORY — DX: Leiomyoma of uterus, unspecified: D25.9

## 2016-09-07 LAB — URINALYSIS W MICROSCOPIC + REFLEX CULTURE
BILIRUBIN URINE: NEGATIVE
CRYSTALS: NONE SEEN [HPF]
Casts: NONE SEEN [LPF]
GLUCOSE, UA: NEGATIVE
HGB URINE DIPSTICK: NEGATIVE
KETONES UR: NEGATIVE
Leukocytes, UA: NEGATIVE
Nitrite: NEGATIVE
RBC / HPF: NONE SEEN RBC/HPF (ref <=2)
Specific Gravity, Urine: 1.024 (ref 1.001–1.035)
Yeast: NONE SEEN [HPF]
pH: 6.5 (ref 5.0–8.0)

## 2016-09-07 LAB — CBC WITH DIFFERENTIAL/PLATELET
BASOS ABS: 52 {cells}/uL (ref 0–200)
BASOS PCT: 1 %
Eosinophils Absolute: 364 cells/uL (ref 15–500)
Eosinophils Relative: 7 %
HEMATOCRIT: 31.1 % — AB (ref 35.0–45.0)
HEMOGLOBIN: 10.2 g/dL — AB (ref 11.7–15.5)
LYMPHS ABS: 2288 {cells}/uL (ref 850–3900)
Lymphocytes Relative: 44 %
MCH: 26.6 pg — ABNORMAL LOW (ref 27.0–33.0)
MCHC: 32.8 g/dL (ref 32.0–36.0)
MCV: 81.2 fL (ref 80.0–100.0)
MONO ABS: 416 {cells}/uL (ref 200–950)
MPV: 8.1 fL (ref 7.5–12.5)
Monocytes Relative: 8 %
NEUTROS ABS: 2080 {cells}/uL (ref 1500–7800)
Neutrophils Relative %: 40 %
Platelets: 405 10*3/uL — ABNORMAL HIGH (ref 140–400)
RBC: 3.83 MIL/uL (ref 3.80–5.10)
RDW: 15.2 % — ABNORMAL HIGH (ref 11.0–15.0)
WBC: 5.2 10*3/uL (ref 3.8–10.8)

## 2016-09-07 LAB — COMPREHENSIVE METABOLIC PANEL
ALBUMIN: 3.7 g/dL (ref 3.6–5.1)
ALK PHOS: 57 U/L (ref 33–115)
ALT: 6 U/L (ref 6–29)
AST: 12 U/L (ref 10–30)
BUN: 6 mg/dL — AB (ref 7–25)
CO2: 28 mmol/L (ref 20–31)
Calcium: 8.7 mg/dL (ref 8.6–10.2)
Chloride: 103 mmol/L (ref 98–110)
Creat: 0.66 mg/dL (ref 0.50–1.10)
Glucose, Bld: 71 mg/dL (ref 65–99)
POTASSIUM: 3.8 mmol/L (ref 3.5–5.3)
Sodium: 138 mmol/L (ref 135–146)
TOTAL PROTEIN: 6.8 g/dL (ref 6.1–8.1)
Total Bilirubin: 0.4 mg/dL (ref 0.2–1.2)

## 2016-09-07 LAB — LIPID PANEL
CHOL/HDL RATIO: 2.3 ratio (ref <5.0)
CHOLESTEROL: 131 mg/dL (ref <200)
HDL: 56 mg/dL (ref >50)
LDL Cholesterol: 63 mg/dL (ref <100)
TRIGLYCERIDES: 59 mg/dL (ref <150)
VLDL: 12 mg/dL (ref <30)

## 2016-09-07 LAB — TSH: TSH: 1.43 mIU/L

## 2016-09-07 MED ORDER — LEVONORGESTREL-ETHINYL ESTRAD 0.1-20 MG-MCG PO TABS: ORAL_TABLET | ORAL | 4 refills | Status: DC

## 2016-09-07 NOTE — Patient Instructions (Signed)
Oral Contraception Information Oral contraceptive pills (OCPs) are medicines taken to prevent pregnancy. OCPs work by preventing the ovaries from releasing eggs. The hormones in OCPs also cause the cervical mucus to thicken, preventing the sperm from entering the uterus. The hormones also cause the uterine lining to become thin, not allowing a fertilized egg to attach to the inside of the uterus. OCPs are highly effective when taken exactly as prescribed. However, OCPs do not prevent sexually transmitted diseases (STDs). Safe sex practices, such as using condoms along with the pill, can help prevent STDs.  Before taking the pill, you may have a physical exam and Pap test. Your health care provider may order blood tests. The health care provider will make sure you are a good candidate for oral contraception. Discuss with your health care provider the possible side effects of the OCP you may be prescribed. When starting an OCP, it can take 2 to 3 months for the body to adjust to the changes in hormone levels in your body.  TYPES OF ORAL CONTRACEPTION  The combination pill-This pill contains estrogen and progestin (synthetic progesterone) hormones. The combination pill comes in 21-day, 28-day, or 91-day packs. Some types of combination pills are meant to be taken continuously (365-day pills). With 21-day packs, you do not take pills for 7 days after the last pill. With 28-day packs, the pill is taken every day. The last 7 pills are without hormones. Certain types of pills have more than 21 hormone-containing pills. With 91-day packs, the first 84 pills contain both hormones, and the last 7 pills contain no hormones or contain estrogen only.  The minipill-This pill contains the progesterone hormone only. The pill is taken every day continuously. It is very important to take the pill at the same time each day. The minipill comes in packs of 28 pills. All 28 pills contain the hormone.  ADVANTAGES OF ORAL  CONTRACEPTIVE PILLS  Decreases premenstrual symptoms.   Treats menstrual period cramps.   Regulates the menstrual cycle.   Decreases a heavy menstrual flow.   May treatacne, depending on the type of pill.   Treats abnormal uterine bleeding.   Treats polycystic ovarian syndrome.   Treats endometriosis.   Can be used as emergency contraception.  THINGS THAT CAN MAKE ORAL CONTRACEPTIVE PILLS LESS EFFECTIVE OCPs can be less effective if:   You forget to take the pill at the same time every day.   You have a stomach or intestinal disease that lessens the absorption of the pill.   You take OCPs with other medicines that make OCPs less effective, such as antibiotics, certain HIV medicines, and some seizure medicines.   You take expired OCPs.   You forget to restart the pill on day 7, when using the packs of 21 pills.  RISKS ASSOCIATED WITH ORAL CONTRACEPTIVE PILLS  Oral contraceptive pills can sometimes cause side effects, such as:  Headache.  Nausea.  Breast tenderness.  Irregular bleeding or spotting. Combination pills are also associated with a small increased risk of:  Blood clots.  Heart attack.  Stroke. This information is not intended to replace advice given to you by your health care provider. Make sure you discuss any questions you have with your health care provider. Document Released: 09/26/2002 Document Revised: 10/28/2015 Document Reviewed: 12/25/2012 Elsevier Interactive Patient Education  2017 [employer].

## 2016-09-07 NOTE — Progress Notes (Addendum)
Michelle Serrano 04/30/1971 [id number]   History:    46 y.o.  for annual gyn exam who is a new patient to the practice. She is a gravida 2 para 0 AB 2. Patient not using any form of contraception. Patient reports normal menstrual cycles and is interested in going on oral contraceptive pill. She denied the flu vaccine today. Patient stated with another provider in our community she had a myomectomy as a result of fibroid uterus. Patient like to receive her blood work here today. Patient with past history of lap banding.  Past medical history,surgical history, family history and social history were all reviewed and documented in the EPIC chart.  Gynecologic History Patient's last menstrual period was 08/15/2016 (exact date). Contraception: none Last Pap: 2015. Results were: normal Last mammogram: 2017. Results were: normal  Obstetric History OB History  Gravida Para Term Preterm AB Living  2       2 0  SAB TAB Ectopic Multiple Live Births  2            # Outcome Date GA Lbr Len/2nd Weight Sex Delivery Anes PTL Lv  2 SAB           1 SAB                ROS: A ROS was performed and pertinent positives and negatives are included in the history.  GENERAL: No fevers or chills. HEENT: No change in vision, no earache, sore throat or sinus congestion. NECK: No pain or stiffness. CARDIOVASCULAR: No chest pain or pressure. No palpitations. PULMONARY: No shortness of breath, cough or wheeze. GASTROINTESTINAL: No abdominal pain, nausea, vomiting or diarrhea, melena or bright red blood per rectum. GENITOURINARY: No urinary frequency, urgency, hesitancy or dysuria. MUSCULOSKELETAL: No joint or muscle pain, no back pain, no recent trauma. DERMATOLOGIC: No rash, no itching, no lesions. ENDOCRINE: No polyuria, polydipsia, no heat or cold intolerance. No recent change in weight. HEMATOLOGICAL: No anemia or easy bruising or bleeding. NEUROLOGIC: No headache, seizures, numbness, tingling or weakness.  PSYCHIATRIC: No depression, no loss of interest in normal activity or change in sleep pattern.     Exam: chaperone present  BP 124/74 Comment: lg cuff  Ht 5' 7.25" (1.708 m)   Wt 212 lb 6.4 oz (96.3 kg)   LMP 08/15/2016 (Exact Date)   BMI 33.02 kg/m   Body mass index is 33.02 kg/m.  General appearance : Well developed well nourished female. No acute distress HEENT: Eyes: no retinal hemorrhage or exudates,  Neck supple, trachea midline, no carotid bruits, no thyroidmegaly Lungs: Clear to auscultation, no rhonchi or wheezes, or rib retractions  Heart: Regular rate and rhythm, no murmurs or gallops Breast:Examined in sitting and supine position were symmetrical in appearance, no palpable masses or tenderness,  no skin retraction, no nipple inversion, no nipple discharge, no skin discoloration, no axillary or supraclavicular lymphadenopathy Abdomen: no palpable masses or tenderness, no rebound or guarding Extremities: no edema or skin discoloration or tenderness  Pelvic:  Bartholin, Urethra, Skene Glands: Within normal limits             Vagina: No gross lesions or discharge  Cervix: No gross lesions or discharge  Uterus  approximately 10 week size irregular shaped nontender  Adnexa  difficult to evaluate due to size of uterus  Anus and perineum  normal   Rectovaginal  normal sphincter tone without palpated masses or tenderness  Hemoccult not indicated     Assessment/Plan:  46 y.o. female for annual exam with past history of fibroid resulting myomectomy. Fibroid uterus noted difficult evaluate her adnexa for this reason we are going to give it a baseline here in our practice she is going to schedule an ultrasound in 1-2 weeks. The following screening blood work ordered today fasting: Comprehensive metabolic panel, fasting lipid profile, TSH, CBC, and urinalysis. Pap smear with HPV screening done today. Patient declined flu vaccine today. Patient will be started on  continuous 20 g oral contraceptive pill to withdrawal every 3 months. The risks benefits and pros and cons were discussed include DVT and pulmonary embolism patient fully understands and except. Literature information was provided.   Terrance Mass MD, 11:35 AM 09/07/2016

## 2016-09-07 NOTE — Addendum Note (Signed)
Addended by: Burnett Kanaris on: 09/07/2016 12:14 PM   Modules accepted: Orders

## 2016-09-08 ENCOUNTER — Other Ambulatory Visit: Payer: Self-pay | Admitting: Gynecology

## 2016-09-08 DIAGNOSIS — R928 Other abnormal and inconclusive findings on diagnostic imaging of breast: Secondary | ICD-10-CM

## 2016-09-08 DIAGNOSIS — D500 Iron deficiency anemia secondary to blood loss (chronic): Secondary | ICD-10-CM

## 2016-09-08 LAB — URINE CULTURE

## 2016-09-08 LAB — PAP IG W/ RFLX HPV ASCU

## 2016-09-10 ENCOUNTER — Ambulatory Visit
Admission: RE | Admit: 2016-09-10 | Discharge: 2016-09-10 | Disposition: A | Discharge status: Home / Self Care | Payer: 59 | Source: Ambulatory Visit | Attending: Gynecology | Admitting: Gynecology

## 2016-09-10 DIAGNOSIS — R928 Other abnormal and inconclusive findings on diagnostic imaging of breast: Secondary | ICD-10-CM

## 2016-09-16 ENCOUNTER — Ambulatory Visit (INDEPENDENT_AMBULATORY_CARE_PROVIDER_SITE_OTHER): Payer: 59

## 2016-09-16 ENCOUNTER — Other Ambulatory Visit: Payer: 59

## 2016-09-16 ENCOUNTER — Other Ambulatory Visit: Payer: Self-pay | Admitting: Gynecology

## 2016-09-16 ENCOUNTER — Ambulatory Visit (INDEPENDENT_AMBULATORY_CARE_PROVIDER_SITE_OTHER): Payer: 59 | Admitting: Gynecology

## 2016-09-16 ENCOUNTER — Encounter: Payer: Self-pay | Admitting: Gynecology

## 2016-09-16 ENCOUNTER — Ambulatory Visit: Payer: 59 | Admitting: Gynecology

## 2016-09-16 ENCOUNTER — Telehealth: Payer: Self-pay

## 2016-09-16 VITALS — BP 132/80

## 2016-09-16 DIAGNOSIS — D251 Intramural leiomyoma of uterus: Secondary | ICD-10-CM

## 2016-09-16 DIAGNOSIS — N83202 Unspecified ovarian cyst, left side: Secondary | ICD-10-CM | POA: Diagnosis not present

## 2016-09-16 DIAGNOSIS — R1900 Intra-abdominal and pelvic swelling, mass and lump, unspecified site: Secondary | ICD-10-CM | POA: Diagnosis not present

## 2016-09-16 DIAGNOSIS — N83201 Unspecified ovarian cyst, right side: Secondary | ICD-10-CM

## 2016-09-16 DIAGNOSIS — N9489 Other specified conditions associated with female genital organs and menstrual cycle: Secondary | ICD-10-CM

## 2016-09-16 DIAGNOSIS — D259 Leiomyoma of uterus, unspecified: Secondary | ICD-10-CM | POA: Diagnosis not present

## 2016-09-16 DIAGNOSIS — N949 Unspecified condition associated with female genital organs and menstrual cycle: Secondary | ICD-10-CM

## 2016-09-16 NOTE — Telephone Encounter (Signed)
I called patient to discuss scheduling surgery. We reviewed her ins benefits and her estimated surgery prepayment due by one week prior to surgery.  We discussed available date. She wants to discuss with her husband tonight and she will call me tomorrow to let me know her decision.

## 2016-09-16 NOTE — Patient Instructions (Signed)
Ovarian Cyst  An ovarian cyst is a fluid-filled sac that forms on an ovary. The ovaries are small organs that produce eggs in women. Various types of cysts can form on the ovaries. Some may cause symptoms and require treatment. Most ovarian cysts go away on their own, are not cancerous (are benign), and do not cause problems. Common types of ovarian cysts include:  Functional (follicle) cysts.  Occur during the menstrual cycle, and usually go away with the next menstrual cycle if you do not get pregnant.  Usually cause no symptoms.  Endometriomas.  Are cysts that form from the tissue that lines the uterus (endometrium).  Are sometimes called "chocolate cysts" because they become filled with blood that turns brown.  Can cause pain in the lower abdomen during intercourse and during your period.  Cystadenoma cysts.  Develop from cells on the outside surface of the ovary.  Can get very large and cause lower abdomen pain and pain with intercourse.  Can cause severe pain if they twist or break open (rupture).  Dermoid cysts.  Are sometimes found in both ovaries.  May contain different kinds of body tissue, such as skin, teeth, hair, or cartilage.  Usually do not cause symptoms unless they get very big.  Theca lutein cysts.  Occur when too much of a certain hormone (human chorionic gonadotropin) is produced and overstimulates the ovaries to produce an egg.  Are most common after having procedures used to assist with the conception of a baby (in vitro fertilization). What are the causes? Ovarian cysts may be caused by:  Ovarian hyperstimulation syndrome. This is a condition that can develop from taking fertility medicines. It causes multiple large ovarian cysts to form.  Polycystic ovarian syndrome (PCOS). This is a common hormonal disorder that can cause ovarian cysts, as well as problems with your period or fertility. What increases the risk? The following factors may make you  more likely to develop ovarian cysts:  Being overweight or obese.  Taking fertility medicines.  Taking certain forms of hormonal birth control.  Smoking. What are the signs or symptoms? Many ovarian cysts do not cause symptoms. If symptoms are present, they may include:  Pelvic pain or pressure.  Pain in the lower abdomen.  Pain during sex.  Abdominal swelling.  Abnormal menstrual periods.  Increasing pain with menstrual periods. How is this diagnosed? These cysts are commonly found during a routine pelvic exam. You may have tests to find out more about the cyst, such as:  Ultrasound.  X-ray of the pelvis.  CT scan.  MRI.  Blood tests. How is this treated? Many ovarian cysts go away on their own without treatment. Your health care provider may want to check your cyst regularly for 2-3 months to see if it changes. If you are in menopause, it is especially important to have your cyst monitored closely because menopausal women have a higher rate of ovarian cancer. When treatment is needed, it may include:  Medicines to help relieve pain.  A procedure to drain the cyst (aspiration).  Surgery to remove the whole cyst.  Hormone treatment or birth control pills. These methods are sometimes used to help dissolve a cyst. Follow these instructions at home:  Take over-the-counter and prescription medicines only as told by your health care provider.  Do not drive or use heavy machinery while taking prescription pain medicine.  Get regular pelvic exams and Pap tests as often as told by your health care provider.  Return to your   normal activities as told by your health care provider. Ask your health care provider what activities are safe for you.  Do not use any products that contain nicotine or tobacco, such as cigarettes and e-cigarettes. If you need help quitting, ask your health care provider.  Keep all follow-up visits as told by your health care provider. This is  important. Contact a health care provider if:  Your periods are late, irregular, or painful, or they stop.  You have pelvic pain that does not go away.  You have pressure on your bladder or trouble emptying your bladder completely.  You have pain during sex.  You have any of the following in your abdomen:  A feeling of fullness.  Pressure.  Discomfort.  Pain that does not go away.  Swelling.  You feel generally ill.  You become constipated.  You lose your appetite.  You develop severe acne.  You start to have more body hair and facial hair.  You are gaining weight or losing weight without changing your exercise and eating habits.  You think you may be pregnant. Get help right away if:  You have abdominal pain that is severe or gets worse.  You cannot eat or drink without vomiting.  You suddenly develop a fever.  Your menstrual period is much heavier than usual. This information is not intended to replace advice given to you by your health care provider. Make sure you discuss any questions you have with your health care provider. Document Released: 07/06/2005 Document Revised: 01/24/2016 Document Reviewed: 12/08/2015 Elsevier Interactive Patient Education  2017 [employer]  

## 2016-09-16 NOTE — Telephone Encounter (Signed)
Encounter not needed

## 2016-09-16 NOTE — Progress Notes (Signed)
Michelle Serrano is an 46 y.o. female was seen in the office as a new patient for the first time over a week ago.She is a gravida 2 para 0 AB 2. Patient not using any form of contraception. Patient reports normal menstrual cycles and is interested in going on oral contraceptive pill. Patient is here to discuss the ultrasound as well as now for preoperative consultation as a result of findings of fullness at time of exam and ultrasound demonstrating the following:  Uterus measured 8.7 x 6.2 x 4.9 mm with endometrial stripe of 7.6 mm. Intramural fibroid measuring 21 x 13 mm was noted. Right ovarian cyst echo-free measuring 33 x 29 m, negative color flow. Tubular cystic mass serpentine shaped negative color flow measuring 66 x 20 x 32 mm. Left ovary was not seen left adnexal cystic solid mass measuring 9.3 x 7.0 x 8.3 cm diffuse internal low level echoes homogeneous echo pattern was noted with arterial blood flow seen within the mass. There was no fluid in the cul-de-sac.  Patient stated that many years ago she had laparoscopy and endometriosis was identified and also in the fashion gone through 2 IVF cycles in the past as well as she had abdominal myomectomy in 2012.  Pertinent Gynecological History: Menses: Regular Bleeding: Regular Contraception: condoms DES exposure: denies Blood transfusions: none Sexually transmitted diseases: no past history Previous GYN Procedures: Laparoscopy, myomectomy, IVF  Last mammogram: normal Date: Within the past 12 months Last pap: normal Date: 2018 OB History: G 2, P 0A0   Menstrual History: Menarche age: 70 Patient's last menstrual period was 09/10/2016.    Past Medical History:  Diagnosis Date  . Anemia   . Asthma   . Infertility, female   . LAP-BAND surgery status 07-24-2007  . Uterine fibroids affecting pregnancy     Past Surgical History:  Procedure Laterality Date  . FINGER SURGERY     rt little finger  . LAPAROSCOPIC GASTRIC BANDING   07-24-2007  . MYOMECTOMY ABDOMINAL APPROACH  2009   [place] - D Kerin Perna  . scar tissue removal in uterus  2011  . TONSILECTOMY, ADENOIDECTOMY, BILATERAL MYRINGOTOMY AND TUBES      Family History  Problem Relation Age of Onset  . Cancer Mother 58    Sarcoma - passed 2010  . Breast cancer Maternal Grandmother     after age 68  . Diabetes Maternal Grandmother   . Anesthesia problems Neg Hx     Social History:  reports that she has never smoked. She has never used smokeless tobacco. She reports that she does not drink alcohol or use drugs.  Allergies:  Allergies  Allergen Reactions  . Aspirin Hives  . Latex Hives  . Other Other (See Comments)    Patient has been allergy tested and there are too many food allergies for me to list She does have an Epipen  . Shellfish Allergy Hives     (Not in a hospital admission)  REVIEW OF SYSTEMS: A ROS was performed and pertinent positives and negatives are included in the history.  GENERAL: No fevers or chills. HEENT: No change in vision, no earache, sore throat or sinus congestion. NECK: No pain or stiffness. CARDIOVASCULAR: No chest pain or pressure. No palpitations. PULMONARY: No shortness of breath, cough or wheeze. GASTROINTESTINAL: No abdominal pain, nausea, vomiting or diarrhea, melena or bright red blood per rectum. GENITOURINARY: No urinary frequency, urgency, hesitancy or dysuria. MUSCULOSKELETAL: No joint or muscle pain, no back pain, no  recent trauma. DERMATOLOGIC: No rash, no itching, no lesions. ENDOCRINE: No polyuria, polydipsia, no heat or cold intolerance. No recent change in weight. HEMATOLOGICAL: No anemia or easy bruising or bleeding. NEUROLOGIC: No headache, seizures, numbness, tingling or weakness. PSYCHIATRIC: No depression, no loss of interest in normal activity or change in sleep pattern.     Blood pressure 132/80, last menstrual period 09/10/2016.  Physical Exam: Done 9 days ago as  follows:  HEENT:unremarkable Neck:Supple, midline, no thyroid megaly, no carotid bruits Lungs:  Clear to auscultation no rhonchi's or wheezes Heart:Regular rate and rhythm, no murmurs or gallops Breast Exam: Symmetrical in appearance no palpable masses or tenderness Abdomen: Soft nontender no rebound or guarding Pelvic:BUS within normal limits Vagina: No lesions or discharge Cervix: No lesions or discharge Uterus: 10 week size Adnexa: Difficult to evaluate due to patient's abdominal girth Extremities: No cords, no edema Rectal: Not done   Assessment/Plan: 46 year old patient with large ovarian mass highly suspicious for endometrioma based on her past history of endometriosis from prior laparoscopy. Patient also with a large right hydrosalpinx and a smaller right ovarian cyst. Patient was counseled for the following procedure: Exploratory laparotomy with left ovarian cystectomy possible left salpingo-oophorectomy with right salpingectomy and possible right cystectomy possible right salpingo-oophorectomy. The following risk for surgery were discussed with patient:                        Patient was counseled as to the risk of surgery to include the following:  1. Infection (prohylactic antibiotics will be administered)  2. DVT/Pulmonary Embolism (prophylactic pneumo compression stockings will be used)  3.Trauma to internal organs requiring additional surgical procedure to repair any injury to     Internal organs requiring perhaps additional hospitalization days.  4.Hemmorhage requiring transfusion and blood products which carry risks such as  anaphylactic reaction, hepatitis and AIDS  Patient had received literature information on the procedure scheduled and all her questions were answered and fully accepts all risk.   FERNANDEZ,JUAN HMD10:12 AMTD     Uvaldo Rising H 09/16/2016, 10:06 AM

## 2016-09-25 ENCOUNTER — Telehealth: Payer: Self-pay

## 2016-09-25 ENCOUNTER — Encounter: Payer: Self-pay | Admitting: Gynecology

## 2016-09-25 NOTE — Telephone Encounter (Signed)
I called patient to inform her that the first case cancelled on 10/20/16 and I was moving her to 7:30am. I had warned her when we talked earlier today that this might happen.

## 2016-09-30 DIAGNOSIS — Z0289 Encounter for other administrative examinations: Secondary | ICD-10-CM

## 2016-10-08 ENCOUNTER — Encounter (HOSPITAL_COMMUNITY)
Admission: RE | Admit: 2016-10-08 | Discharge: 2016-10-08 | Disposition: A | Discharge status: Home / Self Care | Payer: 59 | Source: Ambulatory Visit | Attending: Gynecology | Admitting: Gynecology

## 2016-10-08 ENCOUNTER — Encounter (HOSPITAL_COMMUNITY): Payer: Self-pay

## 2016-10-08 DIAGNOSIS — R1909 Other intra-abdominal and pelvic swelling, mass and lump: Secondary | ICD-10-CM | POA: Diagnosis not present

## 2016-10-08 DIAGNOSIS — Z0183 Encounter for blood typing: Secondary | ICD-10-CM | POA: Insufficient documentation

## 2016-10-08 DIAGNOSIS — Z01812 Encounter for preprocedural laboratory examination: Secondary | ICD-10-CM | POA: Diagnosis present

## 2016-10-08 HISTORY — DX: Pneumonia, unspecified organism: J18.9

## 2016-10-08 LAB — CBC
HEMATOCRIT: 30.6 % — AB (ref 36.0–46.0)
Hemoglobin: 10.6 g/dL — ABNORMAL LOW (ref 12.0–15.0)
MCH: 27.7 pg (ref 26.0–34.0)
MCHC: 34.6 g/dL (ref 30.0–36.0)
MCV: 80.1 fL (ref 78.0–100.0)
Platelets: 384 10*3/uL (ref 150–400)
RBC: 3.82 MIL/uL — ABNORMAL LOW (ref 3.87–5.11)
RDW: 14.8 % (ref 11.5–15.5)
WBC: 4.6 10*3/uL (ref 4.0–10.5)

## 2016-10-08 LAB — TYPE AND SCREEN
ABO/RH(D): A POS
ANTIBODY SCREEN: NEGATIVE

## 2016-10-08 LAB — ABO/RH: ABO/RH(D): A POS

## 2016-10-08 NOTE — Patient Instructions (Addendum)
Your procedure is scheduled on: Tuesday April 3,2018  Enter through the Main Entrance of [facility] at: 6:00 am  Pick up the phone at the desk and dial [phone].  Call this number if you have problems the morning of surgery: [phone].  Remember: Do NOT eat food or drink any fluids after: Midnight Monday October 19, 2016  Take these medicines the morning of surgery with a SIP OF WATER: NONE  Bring Albuterol Inhaler with you day of surgery  Do NOT wear jewelry (body piercing), metal hair clips/bobby pins, make-up, or nail polish. Do NOT wear lotions, powders, or perfumes.  You may wear deoderant. Do NOT shave for 48 hours prior to surgery. Do NOT bring valuables to the hospital. Contacts, dentures, or bridgework may not be worn into surgery. Leave suitcase in car.  After surgery it may be brought to your room.  For patients admitted to the hospital, checkout time is 11:00 AM the day of discharge.

## 2016-10-13 SURGERY — Surgical Case
Anesthesia: *Unknown

## 2016-10-15 ENCOUNTER — Encounter: Payer: Self-pay | Admitting: Anesthesiology

## 2016-10-19 NOTE — H&P (Signed)
Michelle Serrano is an 46 y.o. female was seen in the office as a new patient for the first time over a week ago.She is a gravida 2 para 0 AB 2. Patient not using any form of contraception. Patient reportsnormal menstrual cyclesand is interestedin going on oral contraceptive pill. Patient is here to discuss the ultrasound as well as now for preoperative consultation as a result of findings of fullness at time of exam and ultrasound demonstrating the following:  Uterus measured 8.7 x 6.2 x 4.9 mm with endometrial stripe of 7.6 mm. Intramural fibroid measuring 21 x 13 mm was noted. Right ovarian cyst echo-free measuring 33 x 29 m, negative color flow. Tubular cystic mass serpentine shaped negative color flow measuring 66 x 20 x 32 mm. Left ovary was not seen left adnexal cystic solid mass measuring 9.3 x 7.0 x 8.3 cm diffuse internal low level echoes homogeneous echo pattern was noted with arterial blood flow seen within the mass. There was no fluid in the cul-de-sac.  Patient stated that many years ago she had laparoscopy and endometriosis was identified and also in the fashion gone through 2 IVF cycles in the past as well as she had abdominal myomectomy in 2012.  Pertinent Gynecological History: Menses: Regular Bleeding: Regular Contraception: condoms DES exposure: denies Blood transfusions: none Sexually transmitted diseases: no past history Previous GYN Procedures: Laparoscopy, myomectomy, IVF  Last mammogram: normal Date: Within the past 12 months Last pap: normal Date: 2018 OB History: G 2, P 0A0             Menstrual History: Menarche age: 58 Patient's last menstrual period was 09/10/2016.      Past Medical History:  Diagnosis Date  . Anemia   . Asthma   . Infertility, female   . LAP-BAND surgery status 07-24-2007  . Uterine fibroids affecting pregnancy          Past Surgical History:  Procedure Laterality Date  . FINGER SURGERY     rt little finger  .  LAPAROSCOPIC GASTRIC BANDING  07-24-2007  . MYOMECTOMY ABDOMINAL APPROACH  2009   [place] - D Kerin Perna  . scar tissue removal in uterus  2011  . TONSILECTOMY, ADENOIDECTOMY, BILATERAL MYRINGOTOMY AND TUBES            Family History  Problem Relation Age of Onset  . Cancer Mother 53    Sarcoma - passed 2010  . Breast cancer Maternal Grandmother     after age 4  . Diabetes Maternal Grandmother   . Anesthesia problems Neg Hx     Social History:  reports that she has never smoked. She has never used smokeless tobacco. She reports that she does not drink alcohol or use drugs.  Allergies:       Allergies  Allergen Reactions  . Aspirin Hives  . Latex Hives  . Other Other (See Comments)    Patient has been allergy tested and there are too many food allergies for me to list She does have an Epipen  . Shellfish Allergy Hives     (Not in a hospital admission)  REVIEW OF SYSTEMS: A ROS was performed and pertinent positives and negatives are included in the history.  GENERAL: No fevers or chills. HEENT: No change in vision, no earache, sore throat or sinus congestion. NECK: No pain or stiffness. CARDIOVASCULAR: No chest pain or pressure. No palpitations. PULMONARY: No shortness of breath, cough or wheeze. GASTROINTESTINAL: No abdominal pain, nausea, vomiting or diarrhea, melena  or bright red blood per rectum. GENITOURINARY: No urinary frequency, urgency, hesitancy or dysuria. MUSCULOSKELETAL: No joint or muscle pain, no back pain, no recent trauma. DERMATOLOGIC: No rash, no itching, no lesions. ENDOCRINE: No polyuria, polydipsia, no heat or cold intolerance. No recent change in weight. HEMATOLOGICAL: No anemia or easy bruising or bleeding. NEUROLOGIC: No headache, seizures, numbness, tingling or weakness. PSYCHIATRIC: No depression, no loss of interest in normal activity or change in sleep pattern.     Blood pressure 132/80, last menstrual period  09/10/2016.  Physical Exam: Done 9 days ago as follows:  HEENT:unremarkable Neck:Supple, midline, no thyroid megaly, no carotid bruits Lungs:  Clear to auscultation no rhonchi's or wheezes Heart:Regular rate and rhythm, no murmurs or gallops Breast Exam: Symmetrical in appearance no palpable masses or tenderness Abdomen: Soft nontender no rebound or guarding Pelvic:BUS within normal limits Vagina: No lesions or discharge Cervix: No lesions or discharge Uterus: 10 week size Adnexa: Difficult to evaluate due to patient's abdominal girth Extremities: No cords, no edema Rectal: Not done   Assessment/Plan: 46 year old patient with large ovarian mass highly suspicious for endometrioma based on her past history of endometriosis from prior laparoscopy. Patient also with a large right hydrosalpinx and a smaller right ovarian cyst. Patient was counseled for the following procedure: Exploratory laparotomy with left ovarian cystectomy possible left salpingo-oophorectomy with right salpingectomy and possible right cystectomy possible right salpingo-oophorectomy. The following risk for surgery were discussed with patient:                        Patient was counseled as to the risk of surgery to include the following:  1. Infection (prohylactic antibiotics will be administered)  2. DVT/Pulmonary Embolism (prophylactic pneumo compression stockings will be used)  3.Trauma to internal organs requiring additional surgical procedure to repair any injury to     Internal organs requiring perhaps additional hospitalization days.  4.Hemmorhage requiring transfusion and blood products which carry risks such as  anaphylactic reaction, hepatitis and AIDS  Patient had received literature information on the procedure scheduled and all her questions were answered and fully accepts all risk.

## 2016-10-19 NOTE — Anesthesia Preprocedure Evaluation (Addendum)
Anesthesia Evaluation  Patient identified by MRN, date of birth, ID band Patient awake    Reviewed: Allergy & Precautions, NPO status , Patient's Chart, lab work & pertinent test results  Airway Mallampati: II  TM Distance: >3 FB Neck ROM: Full    Dental no notable dental hx.    Pulmonary neg pulmonary ROS,    Pulmonary exam normal breath sounds clear to auscultation       Cardiovascular negative cardio ROS Normal cardiovascular exam Rhythm:Regular Rate:Normal     Neuro/Psych negative neurological ROS  negative psych ROS   GI/Hepatic negative GI ROS, Neg liver ROS,   Endo/Other  negative endocrine ROS  Renal/GU negative Renal ROS     Musculoskeletal negative musculoskeletal ROS (+)   Abdominal (+) + obese,   Peds  Hematology negative hematology ROS (+)   Anesthesia Other Findings   Reproductive/Obstetrics negative OB ROS                           Anesthesia Physical Anesthesia Plan  ASA: II  Anesthesia Plan: General   Post-op Pain Management:    Induction: Intravenous  Airway Management Planned: Oral ETT  Additional Equipment:   Intra-op Plan:   Post-operative Plan: Extubation in OR  Informed Consent: I have reviewed the patients History and Physical, chart, labs and discussed the procedure including the risks, benefits and alternatives for the proposed anesthesia with the patient or authorized representative who has indicated his/her understanding and acceptance.   Dental advisory given  Plan Discussed with: CRNA  Anesthesia Plan Comments:         Anesthesia Quick Evaluation

## 2016-10-20 ENCOUNTER — Observation Stay (HOSPITAL_COMMUNITY)
Admission: RE | Admit: 2016-10-20 | Discharge: 2016-10-21 | Disposition: A | Discharge status: Home / Self Care | Payer: 59 | Source: Ambulatory Visit | Attending: Gynecology | Admitting: Gynecology

## 2016-10-20 ENCOUNTER — Ambulatory Visit (HOSPITAL_COMMUNITY): Payer: 59 | Admitting: Anesthesiology

## 2016-10-20 ENCOUNTER — Encounter (HOSPITAL_COMMUNITY)
Admission: RE | Disposition: A | Discharge status: Home / Self Care | Payer: Self-pay | Source: Ambulatory Visit | Attending: Gynecology

## 2016-10-20 ENCOUNTER — Encounter (HOSPITAL_COMMUNITY): Payer: Self-pay

## 2016-10-20 DIAGNOSIS — Z9889 Other specified postprocedural states: Secondary | ICD-10-CM

## 2016-10-20 DIAGNOSIS — N803 Endometriosis of pelvic peritoneum: Secondary | ICD-10-CM | POA: Insufficient documentation

## 2016-10-20 DIAGNOSIS — N7011 Chronic salpingitis: Secondary | ICD-10-CM | POA: Diagnosis not present

## 2016-10-20 DIAGNOSIS — N83201 Unspecified ovarian cyst, right side: Secondary | ICD-10-CM | POA: Diagnosis not present

## 2016-10-20 DIAGNOSIS — N736 Female pelvic peritoneal adhesions (postinfective): Secondary | ICD-10-CM | POA: Diagnosis not present

## 2016-10-20 DIAGNOSIS — N838 Other noninflammatory disorders of ovary, fallopian tube and broad ligament: Secondary | ICD-10-CM | POA: Diagnosis not present

## 2016-10-20 DIAGNOSIS — R1900 Intra-abdominal and pelvic swelling, mass and lump, unspecified site: Secondary | ICD-10-CM | POA: Diagnosis not present

## 2016-10-20 HISTORY — DX: Other specified postprocedural states: Z98.890

## 2016-10-20 HISTORY — PX: LAPAROTOMY: SHX154

## 2016-10-20 LAB — PREGNANCY, URINE: PREG TEST UR: NEGATIVE

## 2016-10-20 SURGERY — LAPAROTOMY, EXPLORATORY
Anesthesia: General | Site: Abdomen

## 2016-10-20 MED ORDER — OXYCODONE HCL 5 MG PO TABS: 5.0000 mg | ORAL_TABLET | Freq: Once | ORAL | Status: DC | PRN

## 2016-10-20 MED ORDER — LORATADINE 10 MG PO TABS
10.0000 mg | ORAL_TABLET | Freq: Every day | ORAL | Status: DC
  Filled 2016-10-20 (×2): qty 1

## 2016-10-20 MED ORDER — NALOXONE HCL 0.4 MG/ML IJ SOLN: 0.4000 mg | INTRAMUSCULAR | Status: DC | PRN

## 2016-10-20 MED ORDER — DEXAMETHASONE SODIUM PHOSPHATE 10 MG/ML IJ SOLN
INTRAMUSCULAR | Status: DC | PRN
  Administered 2016-10-20: 4 mg via INTRAVENOUS

## 2016-10-20 MED ORDER — LACTATED RINGERS IV SOLN
INTRAVENOUS | Status: DC
  Administered 2016-10-20: 19:00:00 via INTRAVENOUS

## 2016-10-20 MED ORDER — CEFOTETAN DISODIUM-DEXTROSE 2-2.08 GM-% IV SOLR
INTRAVENOUS | Status: AC
Start: 2016-10-20 — End: 2016-10-20
  Filled 2016-10-20: qty 50

## 2016-10-20 MED ORDER — LEVOCETIRIZINE DIHYDROCHLORIDE 5 MG PO TABS: 5.0000 mg | ORAL_TABLET | Freq: Every evening | ORAL | Status: DC

## 2016-10-20 MED ORDER — ROCURONIUM BROMIDE 100 MG/10ML IV SOLN
INTRAVENOUS | Status: DC | PRN
  Administered 2016-10-20: 40 mg via INTRAVENOUS

## 2016-10-20 MED ORDER — SUGAMMADEX SODIUM 200 MG/2ML IV SOLN
INTRAVENOUS | Status: DC | PRN
  Administered 2016-10-20: 200 mg via INTRAVENOUS

## 2016-10-20 MED ORDER — SODIUM CHLORIDE 0.9 % IJ SOLN
INTRAMUSCULAR | Status: AC
  Filled 2016-10-20: qty 50

## 2016-10-20 MED ORDER — FENTANYL CITRATE (PF) 100 MCG/2ML IJ SOLN
INTRAMUSCULAR | Status: DC | PRN
  Administered 2016-10-20 (×2): 100 ug via INTRAVENOUS
  Administered 2016-10-20: 50 ug via INTRAVENOUS

## 2016-10-20 MED ORDER — CEFOTETAN DISODIUM-DEXTROSE 2-2.08 GM-% IV SOLR
2.0000 g | INTRAVENOUS | Status: AC
  Administered 2016-10-20: 2 g via INTRAVENOUS

## 2016-10-20 MED ORDER — DEXAMETHASONE SODIUM PHOSPHATE 4 MG/ML IJ SOLN
INTRAMUSCULAR | Status: AC
  Filled 2016-10-20: qty 1

## 2016-10-20 MED ORDER — FENTANYL CITRATE (PF) 250 MCG/5ML IJ SOLN
INTRAMUSCULAR | Status: AC
  Filled 2016-10-20: qty 5

## 2016-10-20 MED ORDER — DEXAMETHASONE SODIUM PHOSPHATE 4 MG/ML IJ SOLN
INTRAMUSCULAR | Status: DC | PRN
  Administered 2016-10-20: 4 mg via INTRAVENOUS

## 2016-10-20 MED ORDER — HYDROMORPHONE HCL 1 MG/ML IJ SOLN
INTRAMUSCULAR | Status: AC
  Filled 2016-10-20: qty 1

## 2016-10-20 MED ORDER — HYDROMORPHONE HCL 1 MG/ML IJ SOLN
0.2500 mg | INTRAMUSCULAR | Status: DC | PRN
  Administered 2016-10-20 (×4): 0.5 mg via INTRAVENOUS

## 2016-10-20 MED ORDER — LIDOCAINE HCL (CARDIAC) 20 MG/ML IV SOLN
INTRAVENOUS | Status: DC | PRN
  Administered 2016-10-20: 100 mg via INTRAVENOUS

## 2016-10-20 MED ORDER — SUGAMMADEX SODIUM 200 MG/2ML IV SOLN
INTRAVENOUS | Status: AC
  Filled 2016-10-20: qty 2

## 2016-10-20 MED ORDER — LACTATED RINGERS IV SOLN
INTRAVENOUS | Status: DC
  Administered 2016-10-20: 08:00:00 via INTRAVENOUS
  Administered 2016-10-20: 125 mL/h via INTRAVENOUS

## 2016-10-20 MED ORDER — PROMETHAZINE HCL 25 MG/ML IJ SOLN: 6.2500 mg | INTRAMUSCULAR | Status: DC | PRN

## 2016-10-20 MED ORDER — SCOPOLAMINE 1 MG/3DAYS TD PT72
MEDICATED_PATCH | TRANSDERMAL | Status: AC
  Administered 2016-10-20: 1.5 mg via TRANSDERMAL
  Filled 2016-10-20: qty 1

## 2016-10-20 MED ORDER — PROPOFOL 10 MG/ML IV BOLUS
INTRAVENOUS | Status: DC | PRN
  Administered 2016-10-20: 200 mg via INTRAVENOUS

## 2016-10-20 MED ORDER — KETOROLAC TROMETHAMINE 30 MG/ML IJ SOLN: 30.0000 mg | Freq: Once | INTRAMUSCULAR | Status: DC | PRN

## 2016-10-20 MED ORDER — BUPIVACAINE HCL (PF) 0.25 % IJ SOLN
INTRAMUSCULAR | Status: AC
  Filled 2016-10-20: qty 30

## 2016-10-20 MED ORDER — ONDANSETRON HCL 4 MG/2ML IJ SOLN
INTRAMUSCULAR | Status: AC
  Filled 2016-10-20: qty 2

## 2016-10-20 MED ORDER — FLUTICASONE PROPIONATE 50 MCG/ACT NA SUSP
2.0000 | Freq: Every day | NASAL | Status: DC | PRN
  Filled 2016-10-20: qty 16

## 2016-10-20 MED ORDER — ONDANSETRON HCL 4 MG/2ML IJ SOLN: 4.0000 mg | Freq: Four times a day (QID) | INTRAMUSCULAR | Status: DC | PRN

## 2016-10-20 MED ORDER — ROCURONIUM BROMIDE 100 MG/10ML IV SOLN
INTRAVENOUS | Status: AC
  Filled 2016-10-20: qty 1

## 2016-10-20 MED ORDER — MIDAZOLAM HCL 2 MG/2ML IJ SOLN
INTRAMUSCULAR | Status: AC
  Filled 2016-10-20: qty 2

## 2016-10-20 MED ORDER — MORPHINE SULFATE 2 MG/ML IV SOLN
INTRAVENOUS | Status: DC
  Administered 2016-10-20: 1 mg via INTRAVENOUS
  Administered 2016-10-20: 11:00:00 via INTRAVENOUS
  Administered 2016-10-21: 1 mg via INTRAVENOUS
  Filled 2016-10-20: qty 30

## 2016-10-20 MED ORDER — SCOPOLAMINE 1 MG/3DAYS TD PT72
1.0000 | MEDICATED_PATCH | Freq: Once | TRANSDERMAL | Status: DC
  Administered 2016-10-20: 1.5 mg via TRANSDERMAL

## 2016-10-20 MED ORDER — ONDANSETRON HCL 4 MG/2ML IJ SOLN
INTRAMUSCULAR | Status: DC | PRN
Start: 2016-10-20 — End: 2016-10-20
  Administered 2016-10-20: 4 mg via INTRAVENOUS

## 2016-10-20 MED ORDER — MEPERIDINE HCL 25 MG/ML IJ SOLN: 6.2500 mg | INTRAMUSCULAR | Status: DC | PRN

## 2016-10-20 MED ORDER — SODIUM CHLORIDE 0.9% FLUSH: 9.0000 mL | INTRAVENOUS | Status: DC | PRN

## 2016-10-20 MED ORDER — PROPOFOL 10 MG/ML IV BOLUS
INTRAVENOUS | Status: AC
  Filled 2016-10-20: qty 20

## 2016-10-20 MED ORDER — MIDAZOLAM HCL 2 MG/2ML IJ SOLN
INTRAMUSCULAR | Status: DC | PRN
  Administered 2016-10-20: 2 mg via INTRAVENOUS

## 2016-10-20 MED ORDER — BUPIVACAINE LIPOSOME 1.3 % IJ SUSP
20.0000 mL | Freq: Once | INTRAMUSCULAR | Status: AC
  Administered 2016-10-20: 38 mL
  Filled 2016-10-20: qty 20

## 2016-10-20 MED ORDER — DIPHENHYDRAMINE HCL 12.5 MG/5ML PO ELIX: 12.5000 mg | ORAL_SOLUTION | Freq: Four times a day (QID) | ORAL | Status: DC | PRN

## 2016-10-20 MED ORDER — DIPHENHYDRAMINE HCL 50 MG/ML IJ SOLN
12.5000 mg | Freq: Four times a day (QID) | INTRAMUSCULAR | Status: DC | PRN
  Administered 2016-10-20 (×2): 12.5 mg via INTRAVENOUS
  Filled 2016-10-20 (×2): qty 1

## 2016-10-20 MED ORDER — HYDROMORPHONE HCL 1 MG/ML IJ SOLN
INTRAMUSCULAR | Status: AC
  Administered 2016-10-20: 0.5 mg via INTRAVENOUS
  Filled 2016-10-20: qty 1

## 2016-10-20 MED ORDER — OXYCODONE HCL 5 MG/5ML PO SOLN: 5.0000 mg | Freq: Once | ORAL | Status: DC | PRN

## 2016-10-20 MED ORDER — LIDOCAINE HCL (CARDIAC) 20 MG/ML IV SOLN
INTRAVENOUS | Status: AC
  Filled 2016-10-20: qty 5

## 2016-10-20 MED ORDER — ALBUTEROL SULFATE (2.5 MG/3ML) 0.083% IN NEBU: 2.5000 mg | INHALATION_SOLUTION | Freq: Four times a day (QID) | RESPIRATORY_TRACT | Status: DC | PRN

## 2016-10-20 SURGICAL SUPPLY — 48 items
APL SKNCLS STERI-STRIP NONHPOA (GAUZE/BANDAGES/DRESSINGS) ×2
BENZOIN TINCTURE PRP APPL 2/3 (GAUZE/BANDAGES/DRESSINGS) ×3 IMPLANT
CANISTER SUCT 3000ML PPV (MISCELLANEOUS) ×5 IMPLANT
CELLS DAT CNTRL 66122 CELL SVR (MISCELLANEOUS) IMPLANT
CLOSURE WOUND 1/2 X4 (GAUZE/BANDAGES/DRESSINGS) ×1
CLOTH BEACON ORANGE TIMEOUT ST (SAFETY) ×5 IMPLANT
DECANTER SPIKE VIAL GLASS SM (MISCELLANEOUS) ×6 IMPLANT
DRAPE CESAREAN BIRTH W POUCH (DRAPES) ×5 IMPLANT
DRAPE WARM FLUID 44X44 (DRAPE) ×3 IMPLANT
DRSG OPSITE POSTOP 4X10 (GAUZE/BANDAGES/DRESSINGS) ×5 IMPLANT
DRSG XEROFORM 1X8 (GAUZE/BANDAGES/DRESSINGS) ×2 IMPLANT
DURAPREP 26ML APPLICATOR (WOUND CARE) ×2 IMPLANT
GAUZE SPONGE 4X4 16PLY XRAY LF (GAUZE/BANDAGES/DRESSINGS) IMPLANT
GLOVE BIOGEL PI IND STRL 7.0 (GLOVE) ×8 IMPLANT
GLOVE BIOGEL PI IND STRL 8 (GLOVE) ×4 IMPLANT
GLOVE BIOGEL PI INDICATOR 7.0 (GLOVE) ×8
GLOVE BIOGEL PI INDICATOR 8 (GLOVE) ×4
GLOVE ECLIPSE 7.5 STRL STRAW (GLOVE) ×16 IMPLANT
GOWN STRL REUS W/TWL LRG LVL3 (GOWN DISPOSABLE) ×15 IMPLANT
NEEDLE HYPO 22GX1.5 SAFETY (NEEDLE) ×3 IMPLANT
NS IRRIG 1000ML POUR BTL (IV SOLUTION) ×8 IMPLANT
PACK ABDOMINAL GYN (CUSTOM PROCEDURE TRAY) ×5 IMPLANT
PAD OB MATERNITY 4.3X12.25 (PERSONAL CARE ITEMS) ×5 IMPLANT
PROTECTOR NERVE ULNAR (MISCELLANEOUS) ×7 IMPLANT
RETAINER VISCERAL (MISCELLANEOUS) IMPLANT
RETRACTOR WND ALEXIS 18 MED (MISCELLANEOUS) IMPLANT
RETRACTOR WND ALEXIS 25 LRG (MISCELLANEOUS) IMPLANT
RTRCTR WOUND ALEXIS 18CM MED (MISCELLANEOUS)
RTRCTR WOUND ALEXIS 25CM LRG (MISCELLANEOUS)
SPONGE GAUZE 4X4 12PLY STER LF (GAUZE/BANDAGES/DRESSINGS) ×4 IMPLANT
SPONGE LAP 18X18 X RAY DECT (DISPOSABLE) ×10 IMPLANT
STAPLER VISISTAT 35W (STAPLE) ×2 IMPLANT
STRIP CLOSURE SKIN 1/2X4 (GAUZE/BANDAGES/DRESSINGS) ×2 IMPLANT
SUT VIC AB 0 CT1 18XCR BRD8 (SUTURE) ×8 IMPLANT
SUT VIC AB 0 CT1 36 (SUTURE) ×6 IMPLANT
SUT VIC AB 0 CT1 8-18 (SUTURE) ×8
SUT VIC AB 1 CT1 18XBRD ANBCTR (SUTURE) IMPLANT
SUT VIC AB 1 CT1 8-18 (SUTURE)
SUT VIC AB 3-0 CT1 27 (SUTURE) ×8
SUT VIC AB 3-0 CT1 TAPERPNT 27 (SUTURE) ×6 IMPLANT
SUT VIC AB 4-0 KS 27 (SUTURE) ×3 IMPLANT
SUT VICRYL 0 TIES 12 18 (SUTURE) ×5 IMPLANT
SUT VICRYL 3 0 BR 18  UND (SUTURE)
SUT VICRYL 3 0 BR 18 UND (SUTURE) IMPLANT
SYR CONTROL 10ML LL (SYRINGE) ×3 IMPLANT
TOWEL OR 17X24 6PK STRL BLUE (TOWEL DISPOSABLE) ×10 IMPLANT
TRAY FOLEY CATH SILVER 14FR (SET/KITS/TRAYS/PACK) ×2 IMPLANT
TRAY FOLEY W/METER SILVER 14FR (SET/KITS/TRAYS/PACK) ×3 IMPLANT

## 2016-10-20 NOTE — Interval H&P Note (Signed)
History and Physical Interval Note:  10/20/2016 7:11 AM  Michelle Serrano  has presented today for surgery, with the diagnosis of bilateral pelvic masses  The various methods of treatment have been discussed with the patient and family. After consideration of risks, benefits and other options for treatment, the patient has consented to  Procedure(s) with comments: LAPAROTOMY (N/A) - request to follow first case at around 10:30am  requests 2 hours for the case.   Bilateral ovarian cyst and possible BSO HYSTERECTOMY ABDOMINAL (N/A) - THIS IS NOT A HYSTERECTOMY we had to add this to get supplies until the preference can be changed  HYSTERECTOMY ABDOMINAL WITH SALPINGECTOMY (N/A) as a surgical intervention .  The patient's history has been reviewed, patient examined, no change in status, stable for surgery.  I have reviewed the patient's chart and labs.  Questions were answered to the patient's satisfaction.     Terrance Mass

## 2016-10-20 NOTE — Op Note (Signed)
   Operative Note  10/20/2016  8:42 AM  PATIENT:  Michelle Serrano  46 y.o. female  PRE-OPERATIVE DIAGNOSIS:  bilateral pelvic masses  POST-OPERATIVE DIAGNOSIS:  bilateral pelvic masses  PROCEDURE:  Procedure(s): EXPLORATORY LAPAROTOMY  SURGEON:  Surgeon(s): Terrance Mass, MD Anastasio Auerbach, MD  ANESTHESIA:   general  FINDINGS: Extensive pelvic adhesions with severe stage IV endometriosis. Completely obliterated cul-de-sac small bowel adhered to the fundus of the uterus right pelvic sidewall socked in with hydrosalpinx and bowel adhered. Left adnexa completely adhered with indurated thick adhesions with endometriosis scattered throughout the pelvic cavity. Ovaries not able to be visualized. Anterior uterus free of adhesions or endometriosis.  DESCRIPTION OF OPERATION: The patient was taken to the operating room where she underwent general anesthesia. She received 2 g Cefotan for infection prophylaxis and PSA stockings for DVT prophylaxis. A timeout was undertaken before commencement of the operation outlining possible procedures to be performed. The abdomen was prepped and draped in usual sterile fashion a Foley catheter was inserted to monitor urinary output. A Pfannenstiel incision was made 2 cm above the symphysis pubis. The incision was carried out from the skin down to subcutaneous tissue. The midline nick was made the fascia was incised in a transverse fashion. The peritoneal cavity was entered cautiously and the O'Connor retractor was placed. The following once again was noted:    obliterated cul-de-sac small bowel adhered to the fundus of the uterus right pelvic sidewall socked in with hydrosalpinx and bowel adhered. Left adnexa completely adhered with indurated thick adhesions with endometriosis scattered throughout the pelvic cavity. Ovaries not able to be visualized. Anterior uterus free of adhesions or endometriosis.  After several attempts with meticulous dissection the  bowel was to adhere to the fundus and the back of the uterus to create tissue plane and it was decided to abort the procedure and referred to GYN oncologist with possible general surgeon consultation in the event that patient may need not only about prep but the likelihood of bowel resection and blood transfusion as well.  The above findings were discussed with the spouse after completion of the operation and totally agreed with the decision made and plan as we move forward.  ESTIMATED BLOOD LOSS:  minimal   Intake/Output Summary (Last 24 hours) at 10/20/16 0842 Last data filed at 10/20/16 0830  Gross per 24 hour  Intake             1600 ml  Output               75 ml  Net             1525 ml     BLOOD ADMINISTERED:none   LOCAL MEDICATIONS USED:  OTHER Exparel  SPECIMEN:  Source of Specimen:  none  DISPOSITION OF SPECIMEN:  N/A  COUNTS:  YES  PLAN OF CARE: Transfer to PACU  [facility] HMD8:42 AMTD@

## 2016-10-20 NOTE — Interval H&P Note (Signed)
History and Physical Interval Note:  10/20/2016 7:09 AM  Michelle Serrano  has presented today for surgery, with the diagnosis of bilateral pelvic masses  The various methods of treatment have been discussed with the patient and family. After consideration of risks, benefits and other options for treatment, the patient has consented to  Procedure(s) with comments: LAPAROTOMY (N/A) - request to follow first case at around 10:30am  requests 2 hours for the case.   Bilateral ovarian cyst and possible BSO HYSTERECTOMY ABDOMINAL (N/A) - THIS IS NOT A HYSTERECTOMY we had to add this to get supplies until the preference can be changed  HYSTERECTOMY ABDOMINAL WITH SALPINGECTOMY (N/A) as a surgical intervention .  The patient's history has been reviewed, patient examined, no change in status, stable for surgery.  I have reviewed the patient's chart and labs.  Questions were answered to the patient's satisfaction.     Terrance Mass

## 2016-10-20 NOTE — Anesthesia Procedure Notes (Signed)
Procedure Name: Intubation Date/Time: 10/20/2016 7:49 AM Performed by: Brisa Auth, Sheron Nightingale Pre-anesthesia Checklist: Patient identified, Emergency Drugs available, Suction available, Patient being monitored and Timeout performed Patient Re-evaluated:Patient Re-evaluated prior to inductionOxygen Delivery Method: Circle system utilized Preoxygenation: Pre-oxygenation with 100% oxygen Intubation Type: IV induction Ventilation: Mask ventilation without difficulty Laryngoscope Size: Mac and 4 Grade View: Grade I Tube type: Oral Tube size: 7.0 mm Number of attempts: 1 Placement Confirmation: ETT inserted through vocal cords under direct vision and positive ETCO2 Secured at: 21 cm Dental Injury: Teeth and Oropharynx as per pre-operative assessment

## 2016-10-20 NOTE — Anesthesia Postprocedure Evaluation (Signed)
Anesthesia Post Note  Patient: Michelle Serrano  Procedure(s) Performed: Procedure(s) (LRB): EXPLORATORY LAPAROTOMY (N/A)  Patient location during evaluation: PACU Anesthesia Type: General Level of consciousness: sedated and patient cooperative Pain management: pain level controlled Vital Signs Assessment: post-procedure vital signs reviewed and stable Respiratory status: spontaneous breathing Cardiovascular status: stable Anesthetic complications: no        Last Vitals:  Vitals:   10/20/16 1000 10/20/16 1015  BP: (!) 141/73   Pulse: 60 77  Resp: 19 16  Temp:  37.3 C    Last Pain:  Vitals:   10/20/16 1000  TempSrc:   PainSc: 5    Pain Goal: Patients Stated Pain Goal: 2 (10/20/16 0618)               Nolon Nations

## 2016-10-20 NOTE — Transfer of Care (Signed)
Immediate Anesthesia Transfer of Care Note  Patient: Michelle Serrano  Procedure(s) Performed: Procedure(s) with comments: EXPLORATORY LAPAROTOMY (N/A) - request to follow first case at around 10:30am  requests 2 hours for the case.   Bilateral ovarian cyst and possible BSO  Patient Location: PACU  Anesthesia Type:General  Level of Consciousness: awake, alert  and oriented  Airway & Oxygen Therapy: Patient Spontanous Breathing and Patient connected to nasal cannula oxygen  Post-op Assessment: Report given to RN and Post -op Vital signs reviewed and stable  Post vital signs: Reviewed and stable  Last Vitals:  Vitals:   10/20/16 0618  BP: (!) 143/92  Pulse: 76  Resp: 16  Temp: 36.9 C    Last Pain:  Vitals:   10/20/16 0618  TempSrc: Oral      Patients Stated Pain Goal: 2 (84/53/64 6803)  Complications: No apparent anesthesia complications

## 2016-10-21 ENCOUNTER — Encounter (HOSPITAL_COMMUNITY): Payer: Self-pay | Admitting: Gynecology

## 2016-10-21 DIAGNOSIS — N838 Other noninflammatory disorders of ovary, fallopian tube and broad ligament: Secondary | ICD-10-CM | POA: Diagnosis not present

## 2016-10-21 MED ORDER — OXYCODONE-ACETAMINOPHEN 5-325 MG PO TABS: 1.0000 | ORAL_TABLET | ORAL | 0 refills | Status: DC | PRN

## 2016-10-21 MED ORDER — METOCLOPRAMIDE HCL 10 MG PO TABS: 10.0000 mg | ORAL_TABLET | Freq: Three times a day (TID) | ORAL | 1 refills | Status: DC

## 2016-10-21 MED ORDER — OXYCODONE-ACETAMINOPHEN 5-325 MG PO TABS
1.0000 | ORAL_TABLET | Freq: Once | ORAL | Status: AC
  Administered 2016-10-21: 1 via ORAL
  Filled 2016-10-21: qty 1

## 2016-10-21 NOTE — Progress Notes (Addendum)
Morphine Sulfate PCA 2 mg per ml 29 ml wasted in sink.  Alinda Deem RN witnessed waste.

## 2016-10-21 NOTE — Discharge Summary (Signed)
Physician Discharge Summary  Patient ID: Michelle Serrano MRN: [id number] DOB/AGE: 1970-12-05 46 y.o.  Admit date: 10/20/2016 Discharge date: 10/21/2016  Admission Diagnoses:Bilateral pelvic masses  Discharge Diagnoses:  #1 bilateral pelvic masses #2 stage IV endometriosis #3 extensive abdominal pelvic adhesions  Discharged Condition: good  Hospital Course: Patient was admitted to the hospital on 10/20/2016 for exploratory laparotomy with planned left salpingo-oophorectomy secondary to a large 9.3 x 7.0 x 8.3 cm adnexal cystic solid mass that was seen in ultrasound preoperatively as well as right salpingectomy as a result of a large hydrosalpinx. Upon entrance into the abdominal cavity the following was noted:   Extensive pelvic adhesions with severe stage IV endometriosis. Completely obliterated cul-de-sac small bowel adhered to the fundus of the uterus right pelvic sidewall socked in with hydrosalpinx and bowel adhered. Left adnexa completely adhered with indurated thick adhesions with endometriosis scattered throughout the pelvic cavity. Ovaries not able to be visualized. Anterior uterus free of adhesions or endometriosis  Patient will be referred to the GYN oncologist Dr. Denman George to plan a full total abdominal hysterectomy with bilateral salpingo-oophorectomy and possible general surgical consultation in the event that patient may need a bowel resection at that time as well.  On morning rounds today the patient been up and ambulating voiding. She is tolerating her liquid diet her diet has been advanced and she is to be discharged home today.   Consults: None  Significant Diagnostic Studies: None  Treatments: surgery: Exploratory laparotomy  Discharge Exam: Blood pressure 135/75, pulse 77, temperature 98 F (36.7 C), temperature source Oral, resp. rate (!) 22, SpO2 100 %. General appearance: alert and cooperative Resp: clear to auscultation bilaterally Cardio: regular rate and rhythm,  S1, S2 normal, no murmur, click, rub or gallop GI: soft, non-tender; bowel sounds normal; no masses,  no organomegaly Extremities: extremities normal, atraumatic, no cyanosis or edema Incision/Wound: Incision intact.  Disposition: 01-Home or Self Care  Discharge Instructions    Call MD for:  difficulty breathing, headache or visual disturbances    Complete by:  As directed    Call MD for:  hives    Complete by:  As directed    Call MD for:  persistant dizziness or light-headedness    Complete by:  As directed    Call MD for:  persistant nausea and vomiting    Complete by:  As directed    Call MD for:  redness, tenderness, or signs of infection (pain, swelling, redness, odor or green/yellow discharge around incision site)    Complete by:  As directed    Call MD for:  severe uncontrolled pain    Complete by:  As directed    Call MD for:  temperature >100.4    Complete by:  As directed    Diet general    Complete by:  As directed    Discharge instructions    Complete by:  As directed    My office will call you this week for follow-up appointment with Dr. Denman George.   Driving Restrictions    Complete by:  As directed    1 week   Increase activity slowly    Complete by:  As directed    Lifting restrictions    Complete by:  As directed    6 weeks   Sexual Activity Restrictions    Complete by:  As directed    6 weeks     Allergies as of 10/21/2016      Reactions   Aspirin Hives  Latex Hives   Other Other (See Comments)   Patient has been allergy tested and there are too many food allergies for me to list She does have an Epipen/    Cold tomatoes /    Shellfish Allergy Hives      Medication List    STOP taking these medications   levonorgestrel-ethinyl estradiol 0.1-20 MG-MCG tablet Commonly known as:  AVIANE,ALESSE,LESSINA     TAKE these medications   albuterol 108 (90 Base) MCG/ACT inhaler Commonly known as:  PROVENTIL HFA;VENTOLIN HFA Inhale 2 puffs into the lungs  every 6 (six) hours as needed for wheezing or shortness of breath.   ferrous sulfate 325 (65 FE) MG EC tablet Take 325 mg by mouth 2 (two) times daily.   fluticasone 50 MCG/ACT nasal spray Commonly known as:  FLONASE Place 2 sprays into both nostrils daily as needed for allergies or rhinitis.   levocetirizine 5 MG tablet Commonly known as:  XYZAL Take 5 mg by mouth every evening.   metoCLOPramide 10 MG tablet Commonly known as:  REGLAN Take 1 tablet (10 mg total) by mouth 3 (three) times daily with meals.   oxyCODONE-acetaminophen 5-325 MG tablet Commonly known as:  PERCOCET Take 1 tablet by mouth every 4 (four) hours as needed for severe pain.        SignedTerrance Mass 10/21/2016, 7:30 AM

## 2016-10-21 NOTE — Progress Notes (Signed)
Discharge instructions reviewed with patient and her spouse, prescriptions given and reviewed. Follow up with [name] if she has not heard from them in the next week. Patient and spouse verlbalize understanding.

## 2016-10-23 ENCOUNTER — Telehealth: Payer: Self-pay | Admitting: *Deleted

## 2016-10-23 NOTE — Telephone Encounter (Signed)
-----   Message from Thamas Jaegers, [place] sent at 10/22/2016 10:01 AM EDT -----   ----- Message ----- From: Terrance Mass, MD Sent: 10/21/2016   7:37 AM To: Ramond Craver, RMA  Juliann Pulse, please make an appointment for this patient with Dr. Denman George. Patient with bilateral pelvic masses extensive stage IV endometriosis. Patient discharged from hospital today exploratory laparotomy was performed cannot complete surgery because of extensive stage IV endometriosis and bowel adherence to uterus tubes and ovaries.

## 2016-10-23 NOTE — Telephone Encounter (Signed)
Pt scheduled with Dr.Rossi on 11/04/16 @ 9:15am at [facility] pt informed with time and date.

## 2016-11-04 ENCOUNTER — Ambulatory Visit: Payer: 59 | Attending: Gynecologic Oncology | Admitting: Gynecologic Oncology

## 2016-11-04 ENCOUNTER — Encounter: Payer: Self-pay | Admitting: Gynecologic Oncology

## 2016-11-04 VITALS — BP 137/94 | HR 68 | Temp 98.1°F | Resp 20 | Ht 67.0 in | Wt 208.8 lb

## 2016-11-04 DIAGNOSIS — D251 Intramural leiomyoma of uterus: Secondary | ICD-10-CM | POA: Insufficient documentation

## 2016-11-04 DIAGNOSIS — Z886 Allergy status to analgesic agent status: Secondary | ICD-10-CM | POA: Diagnosis not present

## 2016-11-04 DIAGNOSIS — N801 Endometriosis of ovary: Secondary | ICD-10-CM

## 2016-11-04 DIAGNOSIS — N803 Endometriosis of pelvic peritoneum, unspecified: Secondary | ICD-10-CM

## 2016-11-04 DIAGNOSIS — Z808 Family history of malignant neoplasm of other organs or systems: Secondary | ICD-10-CM | POA: Diagnosis not present

## 2016-11-04 DIAGNOSIS — Z9884 Bariatric surgery status: Secondary | ICD-10-CM | POA: Insufficient documentation

## 2016-11-04 DIAGNOSIS — Z91040 Latex allergy status: Secondary | ICD-10-CM | POA: Diagnosis not present

## 2016-11-04 DIAGNOSIS — Z91013 Allergy to seafood: Secondary | ICD-10-CM | POA: Insufficient documentation

## 2016-11-04 DIAGNOSIS — N9489 Other specified conditions associated with female genital organs and menstrual cycle: Secondary | ICD-10-CM | POA: Insufficient documentation

## 2016-11-04 DIAGNOSIS — Z79899 Other long term (current) drug therapy: Secondary | ICD-10-CM | POA: Insufficient documentation

## 2016-11-04 DIAGNOSIS — Z7951 Long term (current) use of inhaled steroids: Secondary | ICD-10-CM | POA: Diagnosis not present

## 2016-11-04 DIAGNOSIS — N808 Other endometriosis: Secondary | ICD-10-CM | POA: Insufficient documentation

## 2016-11-04 DIAGNOSIS — N809 Endometriosis, unspecified: Secondary | ICD-10-CM

## 2016-11-04 DIAGNOSIS — IMO0002 Reserved for concepts with insufficient information to code with codable children: Secondary | ICD-10-CM | POA: Insufficient documentation

## 2016-11-04 DIAGNOSIS — R1900 Intra-abdominal and pelvic swelling, mass and lump, unspecified site: Secondary | ICD-10-CM

## 2016-11-04 NOTE — Progress Notes (Signed)
Consult Note: Gyn-Onc  Consult was requested by Dr. [name] for the evaluation of Michelle Serrano 46 y.o. female  CC:  Chief Complaint  Patient presents with  . Pelvic Mass    Assessment/Plan:  Michelle Serrano  is a 46 y.o.  year old with 9cm endometrioma and stage IV endometriosis, asymptomatic.  She has not yet attempted medical therapy and is asymptomatic.   I am recommending first commencing continuous OCP's as prescribed by Dr [name]. With respect to the 9cm endometrioma on the left I have a low suspicion for malignancy. I am instead recommending repeat US in 6 months to monitor its stability. I do not feel that a mass of this size will spontaneously regress, however, given that it is asymptomatic, I do not believe there is a compelling reason to subject her to surgery for it at this time.  We discussed that if she preferred, we could proceed with robotic assisted total hysterectomy, BSO. I discussed that there would be a high possibility that she might require a laparotomy, and possibly bowel resection. I discussed that her recovery would have a high risk for bowel perforation or fistula formation. Given these extreme risks and her lack of symptoms, it is unlikely that surgery would provide the patient with much benefit at this time. However, if she becomes symptomatic with pain, rectal bleeding or mass effect symptoms, we could take on a surgical procedure at that time.   She will return to see Dr [name] for postop care and will start her continuous OCP's on day 1 of her next cycle.  HPI: Ms Keir is a 46 year old woman who is seen in consultation at the request of Dr [name] for stage IV endometriosis and a frozen pelvis in the setting of a 9cm left ovarian cyst.  The patient has a history of primary infertility. She had bilateral complex adnexal cysts identified in August, 2008 and a laparoscopy performed by Dr Ubaldo Glassing which was aborted due to extreme pelvic adhesive  disease between the pelvic GI viscera and ovaries and uterus. Significant endometriosis was noted.  The following year (2009) she underwent an uncomplicated laparoscopic lap band procedure with Dr Lucia Gaskins. Review of this op note reveals that no upper abdominal adhesions were noted and the intestines were grossly normal (though the pelvic viscera were not visualized).  Subsequently, approximately 5 years ago, she was seeing Dr Kerin Perna for infertility, and underwent a robotic assisted myomectomy at [facility]. The patient reports this was an uncomplicated procedure. It was an outpatient procedure with rapid recovery. 7 fibroids were removed and she was not informed by her surgeon that there was significant adhesive disease. The operative note from this surgery is not available. She went on to receive 2 unsuccessful cycles of IVF.  In 2018 she was receiving a pelvic exam with Dr [name] and a left pelvic mass was appreciated. A TVUS was performed on 09/16/16 and it identified a 8.7x6.2x4.9cm uterus with a stripe of 7.38mm. An intramural fibroid was noted. A right ovarian cyst was seen measuring 33x58mm. A tubular cystic mass was seen serpentining in the pelvis consistent with a fallopian tube measuring 6.6x2x3.2cm. The left ovary was not definitively seen, but a left adnexal cystic and solid mass measuring 9.3x7x8.3cm was seen with diffuse internal low level echoes. Arterial blood flow was seen within the mass. No fluid was appreciated in the cul de sac.   She was taken to the OR on 09/18/16 by Dr [name] for planned ex  lap, LSO. Surgery was aborted due to extensive pelvic adhesions and severe stage IV endometriosis. There was a completely obliterated cul-de-sac with small bowel adhesions to the fundus of the uterus, right pelvic sidewall, socked in with hydrosalpinx and bowel adhered. The left adnexa was completely adhered with indurated thick adhesions with endometriosis scattered throughout the pelvic cavity.  Ovaries were not able to be visualized. The anterior uterus and pelvis were free of adhesions and endometriosis.   She reports no pelvic pain. She has normal regular cyclic menses, with some moderate pain on day 1 of her cycle which is non-QOL limiting and she does not take pain medication for this. She has no rectal bleeding, pelvic pressure or difficulty with bowel movements.  Current Meds:  Outpatient Encounter Prescriptions as of 11/04/2016  Medication Sig  . albuterol (PROVENTIL HFA;VENTOLIN HFA) 108 (90 Base) MCG/ACT inhaler Inhale 2 puffs into the lungs every 6 (six) hours as needed for wheezing or shortness of breath.  . ferrous sulfate 325 (65 FE) MG EC tablet Take 325 mg by mouth 2 (two) times daily.  . fluticasone (FLONASE) 50 MCG/ACT nasal spray Place 2 sprays into both nostrils daily as needed for allergies or rhinitis.  Marland Kitchen levocetirizine (XYZAL) 5 MG tablet Take 5 mg by mouth every evening.  . metoCLOPramide (REGLAN) 10 MG tablet Take 1 tablet (10 mg total) by mouth 3 (three) times daily with meals.  Marland Kitchen oxyCODONE-acetaminophen (PERCOCET) 5-325 MG tablet Take 1 tablet by mouth every 4 (four) hours as needed for severe pain.   No facility-administered encounter medications on file as of 11/04/2016.     Allergy:  Allergies  Allergen Reactions  . Aspirin Hives  . Latex Hives  . Other Other (See Comments)    Patient has been allergy tested and there are too many food allergies for me to list She does have an Epipen/    Cold tomatoes /   . Shellfish Allergy Hives    Social Hx:   Social History   Social History  . Marital status: Married    Spouse name: N/A  . Number of children: N/A  . Years of education: N/A   Occupational History  . Not on file.   Social History Main Topics  . Smoking status: Never Smoker  . Smokeless tobacco: Never Used  . Alcohol use No  . Drug use: No  . Sexual activity: Yes    Birth control/ protection: None   Other Topics Concern  . Not on  file   Social History Narrative  . No narrative on file    Past Surgical Hx:  Past Surgical History:  Procedure Laterality Date  . DILATION AND CURETTAGE OF UTERUS    . FINGER SURGERY     rt little finger  . LAPAROSCOPIC GASTRIC BANDING  07-24-2007  . LAPAROTOMY N/A 10/20/2016   Procedure: EXPLORATORY LAPAROTOMY;  Surgeon: Terrance Mass, MD;  Location: [facility];  Service: Gynecology;  Laterality: N/A;  request to follow first case at around 10:30am  requests 2 hours for the case.   Bilateral ovarian cyst and possible BSO  . MYOMECTOMY ABDOMINAL APPROACH  2009   [place] - D Kerin Perna  . scar tissue removal in uterus  2011  . TONSILECTOMY, ADENOIDECTOMY, BILATERAL MYRINGOTOMY AND TUBES    . TONSILLECTOMY      Past Medical Hx:  Past Medical History:  Diagnosis Date  . Anemia   . Asthma   . Infertility, female   . LAP-BAND surgery  status 07-24-2007  . Pneumonia 2014  . Uterine fibroids affecting pregnancy     Past Gynecological History:  See above. G2P0 No LMP recorded.  Family Hx:  Family History  Problem Relation Age of Onset  . Cancer Mother 52    Sarcoma - passed 2010  . Breast cancer Maternal Grandmother     after age 16  . Diabetes Maternal Grandmother   . Anesthesia problems Neg Hx     Review of Systems:  Constitutional  Feels well,    ENT Normal appearing ears and nares bilaterally Skin/Breast  No rash, sores, jaundice, itching, dryness Cardiovascular  No chest pain, shortness of breath, or edema  Pulmonary  No cough or wheeze.  Gastro Intestinal  No nausea, vomitting, or diarrhoea. No bright red blood per rectum, no abdominal pain, change in bowel movement, or constipation.  Genito Urinary  No frequency, urgency, dysuria, no abnormal bleeding Musculo Skeletal  No myalgia, arthralgia, joint swelling or pain  Neurologic  No weakness, numbness, change in gait,  Psychology  No depression, anxiety, insomnia.   Vitals:  Blood pressure (!)  137/94, pulse 68, temperature 98.1 F (36.7 C), resp. rate 20, height 5\' 7"  (1.702 m), weight 208 lb 12.8 oz (94.7 kg).  Physical Exam: WD in NAD Neck  Supple NROM, without any enlargements.  Lymph Node Survey No cervical supraclavicular or inguinal adenopathy Cardiovascular  Pulse normal rate, regularity and rhythm. S1 and S2 normal.  Lungs  Clear to auscultation bilateraly, without wheezes/crackles/rhonchi. Good air movement.  Skin  No rash/lesions/breakdown  Psychiatry  Alert and oriented to person, place, and time  Abdomen  Normoactive bowel sounds, abdomen soft, non-tender and overweight without evidence of hernia. Well healed low transverse incision. Back No CVA tenderness Genito Urinary  Vulva/vagina: Normal external female genitalia.   No lesions. No discharge or bleeding.  Bladder/urethra:  No lesions or masses, well supported bladder  Vagina: normal  Cervix: Normal appearing, no lesions.  Uterus: retroverted, slightly bulky, somewhat mobile, no parametrial involvement or nodularity.  Adnexa: left adnexa is filled by immobile mass adherent to uterus and sidewall. Rectal  Good tone, no masses no cul de sac nodularity.  Extremities  No bilateral cyanosis, clubbing or edema.   Donaciano Eva, MD  11/04/2016, 1:02 PM

## 2016-11-04 NOTE — Patient Instructions (Addendum)
See Dr. [name] for [place] visit as scheduled. Begin the Birth control pills on on Day 1 of your cycle. If any questions or concerns,  you can call back to Dr. Serita Grit office at [phone]

## 2016-11-23 ENCOUNTER — Telehealth: Payer: Self-pay | Admitting: *Deleted

## 2016-11-23 NOTE — Telephone Encounter (Signed)
Pt called c/o right side discomfort, had appointment with Dr.Rossi on 11/04/16. Pt never had post-op with JF from surgery on 10/20/16, transferred to front desk to schedule.

## 2016-11-24 ENCOUNTER — Ambulatory Visit (INDEPENDENT_AMBULATORY_CARE_PROVIDER_SITE_OTHER): Payer: 59 | Admitting: Gynecology

## 2016-11-24 ENCOUNTER — Encounter: Payer: Self-pay | Admitting: Gynecology

## 2016-11-24 ENCOUNTER — Telehealth: Payer: Self-pay

## 2016-11-24 VITALS — BP 122/82 | Ht 67.0 in | Wt 213.8 lb

## 2016-11-24 DIAGNOSIS — N803 Endometriosis of pelvic peritoneum: Secondary | ICD-10-CM

## 2016-11-24 DIAGNOSIS — N949 Unspecified condition associated with female genital organs and menstrual cycle: Secondary | ICD-10-CM

## 2016-11-24 DIAGNOSIS — D251 Intramural leiomyoma of uterus: Secondary | ICD-10-CM

## 2016-11-24 DIAGNOSIS — N9489 Other specified conditions associated with female genital organs and menstrual cycle: Secondary | ICD-10-CM

## 2016-11-24 DIAGNOSIS — Z09 Encounter for follow-up examination after completed treatment for conditions other than malignant neoplasm: Secondary | ICD-10-CM

## 2016-11-24 DIAGNOSIS — IMO0002 Reserved for concepts with insufficient information to code with codable children: Secondary | ICD-10-CM

## 2016-11-24 HISTORY — DX: Other specified conditions associated with female genital organs and menstrual cycle: N94.89

## 2016-11-24 NOTE — Telephone Encounter (Signed)
Letter provided to patient

## 2016-11-24 NOTE — Telephone Encounter (Signed)
Yes.  Thank you.

## 2016-11-24 NOTE — Telephone Encounter (Signed)
Patient called stating she was seen today for her 4 week post op visit and you are going to extend her return to work date by 2 weeks.  She said her original return to work date was 12/01/16 and she asked me to write her letter for return for 2 weeks from that day to return on 12/15/16.  OK?

## 2016-11-24 NOTE — Progress Notes (Signed)
Patient presented to the office today for her 4 week postop visit. On 10/20/2016 as a result of bilateral pelvic masses patient underwent exploratory laparotomy. She was complaining of vague lower abdominal discomfort and wanted to extend her time off work for 1-2 weeks since the process of moving and would have to be lifting boxes.  Findings and description of surgery as follows:   Extensive pelvic adhesions with severe stage IV endometriosis. Completely obliterated cul-de-sac small bowel adhered to the fundus of the uterus right pelvic sidewall socked in with hydrosalpinx and bowel adhered. Left adnexa completely adhered with indurated thick adhesions with endometriosis scattered throughout the pelvic cavity. Ovaries not able to be visualized. Anterior uterus free of adhesions or endometriosis.  DESCRIPTION OF OPERATION: The patient was taken to the operating room where she underwent general anesthesia. She received 2 g Cefotan for infection prophylaxis and PSA stockings for DVT prophylaxis. A timeout was undertaken before commencement of the operation outlining possible procedures to be performed. The abdomen was prepped and draped in usual sterile fashion a Foley catheter was inserted to monitor urinary output. A Pfannenstiel incision was made 2 cm above the symphysis pubis. The incision was carried out from the skin down to subcutaneous tissue. The midline nick was made the fascia was incised in a transverse fashion. The peritoneal cavity was entered cautiously and the O'Connor retractor was placed. The following once again was noted:    obliterated cul-de-sac small bowel adhered to the fundus of the uterus right pelvic sidewall socked in with hydrosalpinx and bowel adhered. Left adnexa completely adhered with indurated thick adhesions with endometriosis scattered throughout the pelvic cavity. Ovaries not able to be visualized. Anterior uterus free of adhesions or endometriosis.  After several  attempts with meticulous dissection the bowel was to adhere to the fundus and the back of the uterus to create tissue plane and it was decided to abort the procedure and referred to GYN oncologist with possible general surgeon consultation in the event that patient may need not only about prep but the likelihood of bowel resection and blood transfusion as well.  On April 8 she was seen by Dr. Roanna Raider Oncologist as part of the referral postop. The following was her assessment and plan: "I am recommending first commencing continuous OCP's as prescribed by Dr [name]. With respect to the 9cm endometrioma on the left I have a low suspicion for malignancy. I am instead recommending repeat US in 6 months to monitor its stability. I do not feel that a mass of this size will spontaneously regress, however, given that it is asymptomatic, I do not believe there is a compelling reason to subject her to surgery for it at this time.  We discussed that if she preferred, we could proceed with robotic assisted total hysterectomy, BSO. I discussed that there would be a high possibility that she might require a laparotomy, and possibly bowel resection. I discussed that her recovery would have a high risk for bowel perforation or fistula formation. Given these extreme risks and her lack of symptoms, it is unlikely that surgery would provide the patient with much benefit at this time. However, if she becomes symptomatic with pain, rectal bleeding or mass effect symptoms, we could take on a surgical procedure at that time.   She will return to see Dr [name] for postop care and will start her continuous OCP's on day 1 of her next cycle. I am recommending first commencing continuous OCP's as prescribed by Dr  [name]. With respect to the 9cm endometrioma on the left I have a low suspicion for malignancy. I am instead recommending repeat US in 6 months to monitor its stability. I do not feel that a mass of this size will  spontaneously regress, however, given that it is asymptomatic, I do not believe there is a compelling reason to subject her to surgery for it at this time.  We discussed that if she preferred, we could proceed with robotic assisted total hysterectomy, BSO. I discussed that there would be a high possibility that she might require a laparotomy, and possibly bowel resection. I discussed that her recovery would have a high risk for bowel perforation or fistula formation. Given these extreme risks and her lack of symptoms, it is unlikely that surgery would provide the patient with much benefit at this time. However, if she becomes symptomatic with pain, rectal bleeding or mass effect symptoms, we could take on a surgical procedure at that time. In 2018 she was receiving a pelvic exam with Dr [name] and a left pelvic mass was appreciated. A TVUS was performed on 09/16/16 and it identified a 8.7x6.2x4.9cm uterus with a stripe of 7.57mm. An intramural fibroid was noted. A right ovarian cyst was seen measuring 33x82mm. A tubular cystic mass was seen serpentining in the pelvis consistent with a fallopian tube measuring 6.6x2x3.2cm. The left ovary was not definitively seen, but a left adnexal cystic and solid mass measuring 9.3x7x8.3cm was seen with diffuse internal low level echoes. Arterial blood flow was seen within the mass. No fluid was appreciated in the cul de sac.   She was taken to the OR on 09/18/16 by Dr [name] for planned ex lap, LSO. Surgery was aborted due to extensive pelvic adhesions and severe stage IV endometriosis. There was a completely obliterated cul-de-sac with small bowel adhesions to the fundus of the uterus, right pelvic sidewall, socked in with hydrosalpinx and bowel adhered. The left adnexa was completely adhered with indurated thick adhesions with endometriosis scattered throughout the pelvic cavity. Ovaries were not able to be visualized. The anterior uterus and pelvis were free of  adhesions and endometriosis. "     She will return to see Dr [name] for postop care and will start her continuous OCP's on day 1 of her next cycle.  Exam today: Gen. appearance well-developed well-nourished female in no acute distress Back: No CVA tenderness Abdomen: Soft nontender no rebound or guarding Pfannenstiel scar completely healed intact Pelvic: Bartholin urethra Skene was within normal limits Vagina: No gross lesions on inspection Uterus: Firm 10 weeks size nontender Adnexa difficult to palpate due to size of the uterus Rectal exam: Not done  Assessment/plan: Patient with severe stage IV endometriosis we'll follow recommendation by the GYN oncologist start her on 20 g oral contraceptive pill continuous to withdrawal every 3 months. Also she had recommended a follow-up ultrasound in 6 months monitor size of endometriomas. If she becomes symptomatic and she described above she would then recommend surgery possible robotic hysterectomy with BSO. I have inform patient that I will be retiring in July some her follow-up ultrasound and care with the with my colleague Dr. Dellis Filbert who is also a robotic surgeon in the event that she may want to proceed with doing the case her self or referring back to Dr. Denman George. Patient fully understands and accepts. She was given a two-week extension on her leave from work to recover.

## 2016-11-25 ENCOUNTER — Other Ambulatory Visit: Payer: Self-pay | Admitting: *Deleted

## 2016-11-25 DIAGNOSIS — D250 Submucous leiomyoma of uterus: Secondary | ICD-10-CM

## 2016-12-02 ENCOUNTER — Encounter: Payer: Self-pay | Admitting: Gynecology

## 2017-01-04 ENCOUNTER — Telehealth: Payer: Self-pay

## 2017-01-04 NOTE — Telephone Encounter (Signed)
At patient's request detailed message left in voice mail. patient informed.

## 2017-01-04 NOTE — Telephone Encounter (Signed)
Patient said she is on a "low dose birth contol pill for continuous use to only have 4 cycles a year".  She said she is in the 2nd week of 2nd pack and ever since the placebo week (that she did not take) she has been spotting every day off and on.  She questions is this okay?

## 2017-01-04 NOTE — Telephone Encounter (Signed)
Reassure her that for the first 3-4 months is an adjustment with her hormones. Continue the regimen as prescribed issued correct itself

## 2017-01-19 ENCOUNTER — Telehealth: Payer: Self-pay | Admitting: *Deleted

## 2017-01-19 NOTE — Telephone Encounter (Signed)
Pt called in 3rd pack of pills taking active and has a cycle, day 5 now, normal flow, pt taking pills to have cycle only 4 times per year. Has 1 week left until placebo week. I advised pt to take remaining active pills in 3rd pack and the skip placebo in 3rd pack and start new pack of pills. Pt verbalized she understood, takes pills daily on time. Will follow up if further issues.

## 2017-04-26 ENCOUNTER — Other Ambulatory Visit: Payer: Self-pay

## 2017-04-26 MED ORDER — LEVONORGESTREL-ETHINYL ESTRAD 0.1-20 MG-MCG PO TABS: ORAL_TABLET | ORAL | 1 refills | Status: DC

## 2017-05-17 ENCOUNTER — Other Ambulatory Visit: Payer: Self-pay | Admitting: Obstetrics & Gynecology

## 2017-05-17 DIAGNOSIS — D250 Submucous leiomyoma of uterus: Secondary | ICD-10-CM

## 2017-05-26 ENCOUNTER — Other Ambulatory Visit: Payer: 59

## 2017-05-26 ENCOUNTER — Ambulatory Visit: Payer: 59 | Admitting: Obstetrics & Gynecology

## 2017-05-26 DIAGNOSIS — Z0289 Encounter for other administrative examinations: Secondary | ICD-10-CM

## 2017-08-22 ENCOUNTER — Other Ambulatory Visit: Payer: Self-pay | Admitting: Obstetrics & Gynecology

## 2017-08-23 NOTE — Telephone Encounter (Signed)
annual scheduled on 09/23/17

## 2017-09-08 ENCOUNTER — Encounter: Payer: 59 | Admitting: Gynecology

## 2017-09-08 ENCOUNTER — Encounter: Payer: 59 | Admitting: Obstetrics & Gynecology

## 2017-09-23 ENCOUNTER — Other Ambulatory Visit: Payer: Self-pay | Admitting: Obstetrics & Gynecology

## 2017-09-23 ENCOUNTER — Encounter: Payer: Self-pay | Admitting: Obstetrics & Gynecology

## 2017-09-23 ENCOUNTER — Ambulatory Visit (INDEPENDENT_AMBULATORY_CARE_PROVIDER_SITE_OTHER): Payer: 59 | Admitting: Obstetrics & Gynecology

## 2017-09-23 VITALS — BP 124/80 | Ht 66.0 in | Wt 200.0 lb

## 2017-09-23 DIAGNOSIS — Z01419 Encounter for gynecological examination (general) (routine) without abnormal findings: Secondary | ICD-10-CM | POA: Diagnosis not present

## 2017-09-23 DIAGNOSIS — Z3041 Encounter for surveillance of contraceptive pills: Secondary | ICD-10-CM | POA: Diagnosis not present

## 2017-09-23 DIAGNOSIS — N809 Endometriosis, unspecified: Secondary | ICD-10-CM | POA: Diagnosis not present

## 2017-09-23 DIAGNOSIS — Z139 Encounter for screening, unspecified: Secondary | ICD-10-CM

## 2017-09-23 MED ORDER — LEVONORGESTREL-ETHINYL ESTRAD 0.1-20 MG-MCG PO TABS: ORAL_TABLET | ORAL | 4 refills | Status: DC

## 2017-09-23 NOTE — Patient Instructions (Signed)
1. Encounter for routine gynecological examination with Papanicolaou smear of cervix Normal gyn exam, except fullness of adnexae.  Pap reflex done.  Breast exam normal.  Screening Mammo scheduled tomorrow.  Health labs with Family MD.  2. Encounter for surveillance of contraceptive pills Will use continuously with withdrawal x 5 days only if develops persistent spotting.  3. Severe endometriosis Stage 4 Endometriosis.  Asymptomatic on continuous BCPs, only severe dysmenorrhea with allows a withdrawal period.  Will use continuously as described above.  Will f/u with a Pelvic US to reassess the large Left adnexal mass 9.3 cm on Pelvic US 08/2016.  -Pelvic US future  Other orders - levonorgestrel-ethinyl estradiol (AVIANE,ALESSE,LESSINA) 0.1-20 MG-MCG tablet; TAKE ACTIVE PILL DAILY WITH 5 DAYS OFF IF STARTS SPOTTING  Michelle Serrano, it was a pleasure meeting you today!  I will inform you of your results as soon as they are available.  Health Maintenance, Female Adopting a healthy lifestyle and getting preventive care can go a long way to promote health and wellness. Talk with your health care provider about what schedule of regular examinations is right for you. This is a good chance for you to check in with your provider about disease prevention and staying healthy. In between checkups, there are plenty of things you can do on your own. Experts have done a lot of research about which lifestyle changes and preventive measures are most likely to keep you healthy. Ask your health care provider for more information. Weight and diet Eat a healthy diet  Be sure to include plenty of vegetables, fruits, low-fat dairy products, and lean protein.  Do not eat a lot of foods high in solid fats, added sugars, or salt.  Get regular exercise. This is one of the most important things you can do for your health. ? Most adults should exercise for at least 150 minutes each week. The exercise should increase your heart  rate and make you sweat (moderate-intensity exercise). ? Most adults should also do strengthening exercises at least twice a week. This is in addition to the moderate-intensity exercise.  Maintain a healthy weight  Body mass index (BMI) is a measurement that can be used to identify possible weight problems. It estimates body fat based on height and weight. Your health care provider can help determine your BMI and help you achieve or maintain a healthy weight.  For females 66 years of age and older: ? A BMI below 18.5 is considered underweight. ? A BMI of 18.5 to 24.9 is normal. ? A BMI of 25 to 29.9 is considered overweight. ? A BMI of 30 and above is considered obese.  Watch levels of cholesterol and blood lipids  You should start having your blood tested for lipids and cholesterol at 45 years of age, then have this test every 5 years.  You may need to have your cholesterol levels checked more often if: ? Your lipid or cholesterol levels are high. ? You are older than 45 years of age. ? You are at high risk for heart disease.  Cancer screening Lung Cancer  Lung cancer screening is recommended for adults 55-11 years old who are at high risk for lung cancer because of a history of smoking.  A yearly low-dose CT scan of the lungs is recommended for people who: ? Currently smoke. ? Have quit within the past 15 years. ? Have at least a 30-pack-year history of smoking. A pack year is smoking an average of one pack of cigarettes a day  for 1 year.  Yearly screening should continue until it has been 15 years since you quit.  Yearly screening should stop if you develop a health problem that would prevent you from having lung cancer treatment.  Breast Cancer  Practice breast self-awareness. This means understanding how your breasts normally appear and feel.  It also means doing regular breast self-exams. Let your health care provider know about any changes, no matter how small.  If  you are in your 20s or 30s, you should have a clinical breast exam (CBE) by a health care provider every 1-3 years as part of a regular health exam.  If you are 66 or older, have a CBE every year. Also consider having a breast X-ray (mammogram) every year.  If you have a family history of breast cancer, talk to your health care provider about genetic screening.  If you are at high risk for breast cancer, talk to your health care provider about having an MRI and a mammogram every year.  Breast cancer gene (BRCA) assessment is recommended for women who have family members with BRCA-related cancers. BRCA-related cancers include: ? Breast. ? Ovarian. ? Tubal. ? Peritoneal cancers.  Results of the assessment will determine the need for genetic counseling and BRCA1 and BRCA2 testing.  Cervical Cancer Your health care provider may recommend that you be screened regularly for cancer of the pelvic organs (ovaries, uterus, and vagina). This screening involves a pelvic examination, including checking for microscopic changes to the surface of your cervix (Pap test). You may be encouraged to have this screening done every 3 years, beginning at age 35.  For women ages 69-65, health care providers may recommend pelvic exams and Pap testing every 3 years, or they may recommend the Pap and pelvic exam, combined with testing for human papilloma virus (HPV), every 5 years. Some types of HPV increase your risk of cervical cancer. Testing for HPV may also be done on women of any age with unclear Pap test results.  Other health care providers may not recommend any screening for nonpregnant women who are considered low risk for pelvic cancer and who do not have symptoms. Ask your health care provider if a screening pelvic exam is right for you.  If you have had past treatment for cervical cancer or a condition that could lead to cancer, you need Pap tests and screening for cancer for at least 20 years after your  treatment. If Pap tests have been discontinued, your risk factors (such as having a new sexual partner) need to be reassessed to determine if screening should resume. Some women have medical problems that increase the chance of getting cervical cancer. In these cases, your health care provider may recommend more frequent screening and Pap tests.  Colorectal Cancer  This type of cancer can be detected and often prevented.  Routine colorectal cancer screening usually begins at 45 years of age and continues through 45 years of age.  Your health care provider may recommend screening at an earlier age if you have risk factors for colon cancer.  Your health care provider may also recommend using home test kits to check for hidden blood in the stool.  A small camera at the end of a tube can be used to examine your colon directly (sigmoidoscopy or colonoscopy). This is done to check for the earliest forms of colorectal cancer.  Routine screening usually begins at age 40.  Direct examination of the colon should be repeated every 5-10 years through  45 years of age. However, you may need to be screened more often if early forms of precancerous polyps or small growths are found.  Skin Cancer  Check your skin from head to toe regularly.  Tell your health care provider about any new moles or changes in moles, especially if there is a change in a mole's shape or color.  Also tell your health care provider if you have a mole that is larger than the size of a pencil eraser.  Always use sunscreen. Apply sunscreen liberally and repeatedly throughout the day.  Protect yourself by wearing long sleeves, pants, a wide-brimmed hat, and sunglasses whenever you are outside.  Heart disease, diabetes, and high blood pressure  High blood pressure causes heart disease and increases the risk of stroke. High blood pressure is more likely to develop in: ? People who have blood pressure in the high end of the normal  range (130-139/85-89 mm Hg). ? People who are overweight or obese. ? People who are African American.  If you are 26-45 years of age, have your blood pressure checked every 3-5 years. If you are 2 years of age or older, have your blood pressure checked every year. You should have your blood pressure measured twice-once when you are at a hospital or clinic, and once when you are not at a hospital or clinic. Record the average of the two measurements. To check your blood pressure when you are not at a hospital or clinic, you can use: ? An automated blood pressure machine at a pharmacy. ? A home blood pressure monitor.  If you are between 43 years and 22 years old, ask your health care provider if you should take aspirin to prevent strokes.  Have regular diabetes screenings. This involves taking a blood sample to check your fasting blood sugar level. ? If you are at a normal weight and have a low risk for diabetes, have this test once every three years after 45 years of age. ? If you are overweight and have a high risk for diabetes, consider being tested at a younger age or more often. Preventing infection Hepatitis B  If you have a higher risk for hepatitis B, you should be screened for this virus. You are considered at high risk for hepatitis B if: ? You were born in a country where hepatitis B is common. Ask your health care provider which countries are considered high risk. ? Your parents were born in a high-risk country, and you have not been immunized against hepatitis B (hepatitis B vaccine). ? You have HIV or AIDS. ? You use needles to inject street drugs. ? You live with someone who has hepatitis B. ? You have had sex with someone who has hepatitis B. ? You get hemodialysis treatment. ? You take certain medicines for conditions, including cancer, organ transplantation, and autoimmune conditions.  Hepatitis C  Blood testing is recommended for: ? Everyone born from [birth year] through  1963. ? Anyone with known risk factors for hepatitis C.  Sexually transmitted infections (STIs)  You should be screened for sexually transmitted infections (STIs) including gonorrhea and chlamydia if: ? You are sexually active and are younger than 45 years of age. ? You are older than 45 years of age and your health care provider tells you that you are at risk for this type of infection. ? Your sexual activity has changed since you were last screened and you are at an increased risk for chlamydia or gonorrhea. Ask your  health care provider if you are at risk.  If you do not have HIV, but are at risk, it may be recommended that you take a prescription medicine daily to prevent HIV infection. This is called pre-exposure prophylaxis (PrEP). You are considered at risk if: ? You are sexually active and do not regularly use condoms or know the HIV status of your partner(s). ? You take drugs by injection. ? You are sexually active with a partner who has HIV.  Talk with your health care provider about whether you are at high risk of being infected with HIV. If you choose to begin PrEP, you should first be tested for HIV. You should then be tested every 3 months for as long as you are taking PrEP. Pregnancy  If you are premenopausal and you may become pregnant, ask your health care provider about preconception counseling.  If you may become pregnant, take 400 to 800 micrograms (mcg) of folic acid every day.  If you want to prevent pregnancy, talk to your health care provider about birth control (contraception). Osteoporosis and menopause  Osteoporosis is a disease in which the bones lose minerals and strength with aging. This can result in serious bone fractures. Your risk for osteoporosis can be identified using a bone density scan.  If you are 86 years of age or older, or if you are at risk for osteoporosis and fractures, ask your health care provider if you should be screened.  Ask your health  care provider whether you should take a calcium or vitamin D supplement to lower your risk for osteoporosis.  Menopause may have certain physical symptoms and risks.  Hormone replacement therapy may reduce some of these symptoms and risks. Talk to your health care provider about whether hormone replacement therapy is right for you. Follow these instructions at home:  Schedule regular health, dental, and eye exams.  Stay current with your immunizations.  Do not use any tobacco products including cigarettes, chewing tobacco, or electronic cigarettes.  If you are pregnant, do not drink alcohol.  If you are breastfeeding, limit how much and how often you drink alcohol.  Limit alcohol intake to no more than 1 drink per day for nonpregnant women. One drink equals 12 ounces of beer, 5 ounces of wine, or 1 ounces of hard liquor.  Do not use street drugs.  Do not share needles.  Ask your health care provider for help if you need support or information about quitting drugs.  Tell your health care provider if you often feel depressed.  Tell your health care provider if you have ever been abused or do not feel safe at home. This information is not intended to replace advice given to you by your health care provider. Make sure you discuss any questions you have with your health care provider. Document Released: 01/19/2011 Document Revised: 12/12/2015 Document Reviewed: 04/09/2015 Elsevier Interactive Patient Education  [employer].

## 2017-09-23 NOTE — Progress Notes (Signed)
Michelle Serrano [birth year] [id number]   History:    47 y.o. G2P0A2 Married.  RP:  Established patient presenting for annual gyn exam   HPI:  H/O Stage 4 Endometriosis.  Aborted Laparotomy with Dr [name] because of extent of adhesions with completely obliterated post cul-the-sac and non-visible ovaries in 10/2016.  Pelvic US 08/2016 had shown a complex Left Ovarian mass measuring 9.3 cm.  Seen by Dr Denman George after the Laparotomy, who recommended not to reattempt surgery, but to continue on BCPs and follow up with a repeat Pelvic US given that patient was asymptomatic.  Still well on continuous BCP 1/20 with withdrawal every 3 packs, but moderate to severe dysmenorrhea not controled by Ibuprofen that week, with light flow.  No pelvic pain otherwise.  No pain with IC.  Normal vaginal secretions.  Urine/BMs wnl.  Breasts wnl.  Screening mammo scheduled tomorrow.  Health labs with Fam MD.  Had Gastric by-pass surgery and has kept her weight down with BMI now at 32.28.  Exercises regularly.  Past medical history,surgical history, family history and social history were all reviewed and documented in the EPIC chart.  Gynecologic History Patient's last menstrual period was 08/26/2017. Contraception: OCP (estrogen/progesterone) Last Pap: 08/2016. Results were: Negative Last mammogram: 08/2016. Results were: Negative.  Scheduled Screening Mammo tomorrow Bone Density: Never Colonoscopy: Never  Obstetric History OB History  Gravida Para Term Preterm AB Living  2       2 0  SAB TAB Ectopic Multiple Live Births  2            # Outcome Date GA Lbr Len/2nd Weight Sex Delivery Anes PTL Lv  2 SAB           1 SAB                ROS: A ROS was performed and pertinent positives and negatives are included in the history.  GENERAL: No fevers or chills. HEENT: No change in vision, no earache, sore throat or sinus congestion. NECK: No pain or stiffness. CARDIOVASCULAR: No chest pain or pressure. No  palpitations. PULMONARY: No shortness of breath, cough or wheeze. GASTROINTESTINAL: No abdominal pain, nausea, vomiting or diarrhea, melena or bright red blood per rectum. GENITOURINARY: No urinary frequency, urgency, hesitancy or dysuria. MUSCULOSKELETAL: No joint or muscle pain, no back pain, no recent trauma. DERMATOLOGIC: No rash, no itching, no lesions. ENDOCRINE: No polyuria, polydipsia, no heat or cold intolerance. No recent change in weight. HEMATOLOGICAL: No anemia or easy bruising or bleeding. NEUROLOGIC: No headache, seizures, numbness, tingling or weakness. PSYCHIATRIC: No depression, no loss of interest in normal activity or change in sleep pattern.     Exam:   BP 124/80 (BP Location: Right Arm, Patient Position: Sitting, Cuff Size: Large)   Ht 5\' 6"  (1.676 m)   Wt 200 lb (90.7 kg)   LMP 08/26/2017   BMI 32.28 kg/m   Body mass index is 32.28 kg/m.  General appearance : Well developed well nourished female. No acute distress HEENT: Eyes: no retinal hemorrhage or exudates,  Neck supple, trachea midline, no carotid bruits, no thyroidmegaly Lungs: Clear to auscultation, no rhonchi or wheezes, or rib retractions  Heart: Regular rate and rhythm, no murmurs or gallops Breast:Examined in sitting and supine position were symmetrical in appearance, no palpable masses or tenderness,  no skin retraction, no nipple inversion, no nipple discharge, no skin discoloration, no axillary or supraclavicular lymphadenopathy Abdomen: no palpable masses or tenderness, no rebound or guarding.  H/O Gastric by-pass, valve felt in Rt epigastric area. Extremities: no edema or skin discoloration or tenderness  Pelvic: Vulva: Normal             Vagina: No gross lesions or discharge  Cervix: No gross lesions or discharge.  Pap reflex done  Uterus:  AV, normal size, shape and consistency, non-tender and mobile  Adnexae: Fullness bilaterally, but no mass felt and NT.  Anus: Normal   Assessment/Plan:  47  y.o. female for annual exam   1. Encounter for routine gynecological examination with Papanicolaou smear of cervix Normal gyn exam, except fullness of adnexae.  Pap reflex done.  Breast exam normal.  Screening Mammo scheduled tomorrow.  Health labs with Family MD.  2. Encounter for surveillance of contraceptive pills Will use continuously with withdrawal x 5 days only if develops persistent spotting.  3. Severe endometriosis Stage 4 Endometriosis.  Asymptomatic on continuous BCPs, only severe dysmenorrhea with allows a withdrawal period.  Will use continuously as described above.  Will f/u with a Pelvic US to reassess the large Left adnexal mass 9.3 cm on Pelvic US 08/2016.  -Pelvic US future  Other orders - levonorgestrel-ethinyl estradiol (AVIANE,ALESSE,LESSINA) 0.1-20 MG-MCG tablet; TAKE ACTIVE PILL DAILY WITH 5 DAYS OFF IF STARTS SPOTTING  Counseling on above issues more than 50% for 10 minutes.  [name] MD, 9:53 AM 09/23/2017

## 2017-09-23 NOTE — Addendum Note (Signed)
Addended by: Lorine Bears on: 09/23/2017 10:43 AM   Modules accepted: Orders

## 2017-09-24 ENCOUNTER — Ambulatory Visit
Admission: RE | Admit: 2017-09-24 | Discharge: 2017-09-24 | Disposition: A | Discharge status: Home / Self Care | Payer: 59 | Source: Ambulatory Visit | Attending: Obstetrics & Gynecology | Admitting: Obstetrics & Gynecology

## 2017-09-24 DIAGNOSIS — Z139 Encounter for screening, unspecified: Secondary | ICD-10-CM

## 2017-09-28 LAB — PAP IG W/ RFLX HPV ASCU

## 2017-12-01 ENCOUNTER — Telehealth: Payer: Self-pay

## 2017-12-01 NOTE — Telephone Encounter (Signed)
I had a note from Dr. Marguerita Merles "Please make sure patient scheduled a pelvic u/s to reassess the pelvic cystic mass." No appointment is scheduled so I called patient. Left message for her to call me to schedule.

## 2017-12-03 NOTE — Telephone Encounter (Signed)
Spoke with patient. U/s scheduled for 01/04/18 at 3:30pm.

## 2018-01-04 ENCOUNTER — Ambulatory Visit (INDEPENDENT_AMBULATORY_CARE_PROVIDER_SITE_OTHER): Payer: 59 | Admitting: Obstetrics & Gynecology

## 2018-01-04 ENCOUNTER — Other Ambulatory Visit: Payer: Self-pay | Admitting: Obstetrics & Gynecology

## 2018-01-04 ENCOUNTER — Ambulatory Visit: Payer: 59

## 2018-01-04 DIAGNOSIS — N801 Endometriosis of ovary: Secondary | ICD-10-CM | POA: Diagnosis not present

## 2018-01-04 DIAGNOSIS — N7011 Chronic salpingitis: Secondary | ICD-10-CM | POA: Diagnosis not present

## 2018-01-04 DIAGNOSIS — N9489 Other specified conditions associated with female genital organs and menstrual cycle: Secondary | ICD-10-CM

## 2018-01-04 DIAGNOSIS — N809 Endometriosis, unspecified: Secondary | ICD-10-CM

## 2018-01-04 DIAGNOSIS — D251 Intramural leiomyoma of uterus: Secondary | ICD-10-CM | POA: Diagnosis not present

## 2018-01-04 DIAGNOSIS — N949 Unspecified condition associated with female genital organs and menstrual cycle: Secondary | ICD-10-CM | POA: Diagnosis not present

## 2018-01-04 DIAGNOSIS — N80102 Endometriosis of left ovary, unspecified depth: Secondary | ICD-10-CM

## 2018-01-04 MED ORDER — LEVONORGESTREL-ETHINYL ESTRAD 0.1-20 MG-MCG PO TABS: ORAL_TABLET | ORAL | 4 refills | Status: DC

## 2018-01-04 NOTE — Progress Notes (Signed)
    Michelle Serrano 20-Sep-1970 [id number]        46 y.o.  G2P0020   RP: Stage 4 Endometriosis with a Left Ovarian Endometrioma 9.3 cm for repeat Pelvic US today  HPI: Well on Aviane continuous use.  No pelvic pain.  No breakthrough bleeding since visit on March 2019.  Only had dysmenorrhea when was allowing a withdrawal bleeding every 3 months.  Last pelvic ultrasound February 2018 showed a left ovarian endometrioma measuring 9.3 cm.  Patient had a laparotomy in April 2018 which was aborted because of extensive adhesions and severe endometriosis with obliteration of the posterior cul-de-sac.  Seen by Dr. Denman George after that procedure who did not recommend proceeding with surgery again given the fact that the patient was asymptomatic.   OB History  Gravida Para Term Preterm AB Living  2       2 0  SAB TAB Ectopic Multiple Live Births  2            # Outcome Date GA Lbr Len/2nd Weight Sex Delivery Anes PTL Lv  2 SAB           1 SAB             Past medical history,surgical history, problem list, medications, allergies, family history and social history were all reviewed and documented in the EPIC chart.   Directed ROS with pertinent positives and negatives documented in the history of present illness/assessment and plan.  Exam:  There were no vitals filed for this visit. General appearance:  Normal  Pelvic US today: T/V and T/a images.  Anteverted uterus measuring 8.11 x 5.33 x 4.26 cm.  Intramural fibroids measuring 1.9 x 1.6 cm and 1.3 cm.  Right ovary normal.  Right adnexal tubular cystic serpentine mass measuring 5.8 x 2.5 cm with low-level echoes and negative color flow Doppler.  Left ovary not seen but presence of a left adnexal mass with diffuse homogeneous low-level echoes and negative color flow Doppler measuring 7.4 x 5.3 x 7.2 cm.  No free fluid in the posterior cul-de-sac.   Assessment/Plan:  47 y.o. G2P0020   1. Severe endometriosis Mildly symptomatic.  Pain controled with  continuous BCPs and Ibuprofen as needed when bleeding.  No breakthrough bleeding or withdrawal bleeding since March 2019.  Will continue on continuous BCPs with Aviane.  2. Endometrial cystoma of left ovary Left probable ovarian endometrioma measuring 7.4 cm on pelvic ultrasound today.  Smaller than on Pelvic US 08/2016 when it was measured at 9.3 cm.  Probable right hydrosalpinx.  2 small intramural fibroids 1.9 and 1.3 cm.  Patient reassured.  Agrees with observation on continuous birth control pills.  Other orders - levonorgestrel-ethinyl estradiol (AVIANE,ALESSE,LESSINA) 0.1-20 MG-MCG tablet; TAKE ACTIVE PILL DAILY WITH 5 DAYS OFF IF STARTS SPOTTING  Counseling on above issues and coordination of care more than 50% for 15 minutes.  [name] MD, 4:47 PM 01/04/2018

## 2018-01-09 ENCOUNTER — Encounter: Payer: Self-pay | Admitting: Obstetrics & Gynecology

## 2018-01-09 NOTE — Patient Instructions (Signed)
1. Severe endometriosis Mildly symptomatic.  Pain controled with continuous BCPs and Ibuprofen as needed when bleeding.  No breakthrough bleeding or withdrawal bleeding since March 2019.  Will continue on continuous BCPs with Aviane.  2. Endometrial cystoma of left ovary Left probable ovarian endometrioma measuring 7.4 cm on pelvic ultrasound today.  Smaller than on Pelvic US 08/2016 when it was measured at 9.3 cm.  Probable right hydrosalpinx.  2 small intramural fibroids 1.9 and 1.3 cm.  Patient reassured.  Agrees with observation on continuous birth control pills.  Other orders - levonorgestrel-ethinyl estradiol (AVIANE,ALESSE,LESSINA) 0.1-20 MG-MCG tablet; TAKE ACTIVE PILL DAILY WITH 5 DAYS OFF IF STARTS SPOTTING  Danyle, good seeing you today!

## 2018-11-15 ENCOUNTER — Other Ambulatory Visit: Payer: Self-pay | Admitting: Obstetrics & Gynecology

## 2018-11-15 DIAGNOSIS — Z1231 Encounter for screening mammogram for malignant neoplasm of breast: Secondary | ICD-10-CM

## 2019-01-09 ENCOUNTER — Other Ambulatory Visit: Payer: Self-pay

## 2019-01-09 ENCOUNTER — Ambulatory Visit
Admission: RE | Admit: 2019-01-09 | Discharge: 2019-01-09 | Disposition: A | Discharge status: Home / Self Care | Payer: 59 | Source: Ambulatory Visit | Attending: Obstetrics & Gynecology | Admitting: Obstetrics & Gynecology

## 2019-01-09 DIAGNOSIS — Z1231 Encounter for screening mammogram for malignant neoplasm of breast: Secondary | ICD-10-CM

## 2019-02-06 ENCOUNTER — Other Ambulatory Visit: Payer: Self-pay

## 2019-02-07 ENCOUNTER — Ambulatory Visit (INDEPENDENT_AMBULATORY_CARE_PROVIDER_SITE_OTHER): Payer: 59 | Admitting: Obstetrics & Gynecology

## 2019-02-07 ENCOUNTER — Encounter: Payer: Self-pay | Admitting: Obstetrics & Gynecology

## 2019-02-07 VITALS — BP 118/72 | Ht 66.0 in | Wt 211.0 lb

## 2019-02-07 DIAGNOSIS — Z01419 Encounter for gynecological examination (general) (routine) without abnormal findings: Secondary | ICD-10-CM

## 2019-02-07 DIAGNOSIS — Z6834 Body mass index (BMI) 34.0-34.9, adult: Secondary | ICD-10-CM

## 2019-02-07 DIAGNOSIS — N898 Other specified noninflammatory disorders of vagina: Secondary | ICD-10-CM | POA: Diagnosis not present

## 2019-02-07 DIAGNOSIS — N809 Endometriosis, unspecified: Secondary | ICD-10-CM | POA: Diagnosis not present

## 2019-02-07 DIAGNOSIS — N801 Endometriosis of ovary: Secondary | ICD-10-CM

## 2019-02-07 DIAGNOSIS — Z3041 Encounter for surveillance of contraceptive pills: Secondary | ICD-10-CM

## 2019-02-07 DIAGNOSIS — E6609 Other obesity due to excess calories: Secondary | ICD-10-CM

## 2019-02-07 DIAGNOSIS — N80102 Endometriosis of left ovary, unspecified depth: Secondary | ICD-10-CM

## 2019-02-07 LAB — WET PREP FOR TRICH, YEAST, CLUE

## 2019-02-07 MED ORDER — LEVONORGESTREL-ETHINYL ESTRAD 0.1-20 MG-MCG PO TABS: ORAL_TABLET | ORAL | 4 refills | Status: DC

## 2019-02-07 MED ORDER — TINIDAZOLE 500 MG PO TABS: 2.0000 g | ORAL_TABLET | Freq: Every day | ORAL | 0 refills | Status: AC

## 2019-02-07 NOTE — Progress Notes (Signed)
Michelle Serrano 11/09/1970 [id number]   History:    48 y.o. G2P0A2 Married  RP:  Established patient presenting for annual gyn exam   HPI: Well on continuous BCPs with Alesse 1/20 for severe Endometriosis.  Aborted laparotomy for stage 4 Endometriosis with completely obliterated post cul-de-sac in 10/2016.  No breakthrough bleeding.  No pelvic pain.  No pain with intercourse.  Urine and bowel movements normal.  Breasts normal.  Body mass index 34.06.  Health labs with family physician.  Past medical history,surgical history, family history and social history were all reviewed and documented in the EPIC chart.  Gynecologic History Patient's last menstrual period was 11/27/2018. Contraception: OCP (estrogen/progesterone) Last Pap: 09/2017. Results were: Negative Last mammogram: 12/2018. Results were: Negative Bone Density: Never Colonoscopy: Never  Obstetric History OB History  Gravida Para Term Preterm AB Living  2       2 0  SAB TAB Ectopic Multiple Live Births  2            # Outcome Date GA Lbr Len/2nd Weight Sex Delivery Anes PTL Lv  2 SAB           1 SAB              ROS: A ROS was performed and pertinent positives and negatives are included in the history.  GENERAL: No fevers or chills. HEENT: No change in vision, no earache, sore throat or sinus congestion. NECK: No pain or stiffness. CARDIOVASCULAR: No chest pain or pressure. No palpitations. PULMONARY: No shortness of breath, cough or wheeze. GASTROINTESTINAL: No abdominal pain, nausea, vomiting or diarrhea, melena or bright red blood per rectum. GENITOURINARY: No urinary frequency, urgency, hesitancy or dysuria. MUSCULOSKELETAL: No joint or muscle pain, no back pain, no recent trauma. DERMATOLOGIC: No rash, no itching, no lesions. ENDOCRINE: No polyuria, polydipsia, no heat or cold intolerance. No recent change in weight. HEMATOLOGICAL: No anemia or easy bruising or bleeding. NEUROLOGIC: No headache, seizures,  numbness, tingling or weakness. PSYCHIATRIC: No depression, no loss of interest in normal activity or change in sleep pattern.     Exam:   BP 118/72    Ht 5' 6"  (1.676 m)    Wt 211 lb (95.7 kg)    LMP 11/27/2018 Comment: PILL   BMI 34.06 kg/m   Body mass index is 34.06 kg/m.  General appearance : Well developed well nourished female. No acute distress HEENT: Eyes: no retinal hemorrhage or exudates,  Neck supple, trachea midline, no carotid bruits, no thyroidmegaly Lungs: Clear to auscultation, no rhonchi or wheezes, or rib retractions  Heart: Regular rate and rhythm, no murmurs or gallops Breast:Examined in sitting and supine position were symmetrical in appearance, no palpable masses or tenderness,  no skin retraction, no nipple inversion, no nipple discharge, no skin discoloration, no axillary or supraclavicular lymphadenopathy Abdomen: no palpable masses or tenderness, no rebound or guarding Extremities: no edema or skin discoloration or tenderness  Pelvic: Vulva: Normal             Vagina: No gross lesions.  Increased discharge.  Wet prep done.  Cervix: No gross lesions or discharge.  Pap reflex done.  Uterus  AV, normal size, shape and consistency, non-tender and mobile  Adnexa  Without masses or tenderness  Anus: Normal  Wet prep clue cells present  Pelvic US 12/2017: T/V and T/a images. Anteverted uterus measuring 8.11 x 5.33 x 4.26 cm. Intramural fibroids measuring 1.9 x 1.6 cm and 1.3 cm. Right ovary  normal. Right adnexal tubular cystic serpentine mass measuring 5.8 x 2.5 cm with low-level echoes and negative color flow Doppler. Left ovary not seen but presence of a left adnexal mass with diffuse homogeneous low-level echoes and negative color flow Doppler measuring 7.4 x 5.3 x 7.2 cm. No free fluid in the posterior cul-de-sac.   Assessment/Plan:  48 y.o. female for annual exam   1. Encounter for routine gynecological examination with Papanicolaou smear of  cervix Gynecologic exam nontender with no mass felt.  Pap reflex done.  Breast exam normal.  Last mammogram June 2020 negative.  We will follow-up here for fasting health labs. - CBC; Future - Comp Met (CMET); Future - Lipid panel; Future - TSH; Future - VITAMIN D 25 Hydroxy (Vit-D Deficiency, Fractures); Future  2. Encounter for surveillance of contraceptive pills Well on continuous birth control pill with a least 1/20.  No contraindication to continue.  Represcribed.  3. Vaginal discharge Bacterial vaginosis confirmed by wet prep.  Will treat with tinidazole 2 tablets twice a day for 2 days.  Usage reviewed and prescription sent to pharmacy. - WET PREP FOR [place], YEAST, CLUE  4. Severe endometriosis Severe endometriosis very well controlled on continuous birth control pills.  Will continue with continuous birth control pill.  Decision to repeat a pelvic ultrasound given the large left ovarian mass measuring 7.4 cm on pelvic ultrasound June 2019. - US Transvaginal Non-OB; Future  5. Endometrial cystoma of left ovary As above. - US Transvaginal Non-OB; Future  6. Class 1 obesity due to excess calories without serious comorbidity with body mass index (BMI) of 34.0 to 34.9 in adult Recommend a lower calorie/carb diet such as [employer].  Aerobic physical activities 5 times a week and weightlifting every 2 days.  Other orders - levonorgestrel-ethinyl estradiol (ALESSE) 0.1-20 MG-MCG tablet; TAKE ACTIVE PILL DAILY WITH 5 DAYS OFF IF STARTS SPOTTING - tinidazole (TINDAMAX) 500 MG tablet; Take 4 tablets (2,000 mg total) by mouth daily for 2 days.  [name] MD, 3:25 PM 02/07/2019

## 2019-02-08 ENCOUNTER — Encounter: Payer: Self-pay | Admitting: Obstetrics & Gynecology

## 2019-02-08 ENCOUNTER — Other Ambulatory Visit: Payer: 59

## 2019-02-08 ENCOUNTER — Other Ambulatory Visit: Payer: Self-pay

## 2019-02-08 DIAGNOSIS — Z01419 Encounter for gynecological examination (general) (routine) without abnormal findings: Secondary | ICD-10-CM

## 2019-02-08 NOTE — Addendum Note (Signed)
Addended by: Thurnell Garbe A on: 02/08/2019 09:20 AM   Modules accepted: Orders

## 2019-02-08 NOTE — Patient Instructions (Signed)
1. Encounter for routine gynecological examination with Papanicolaou smear of cervix Gynecologic exam nontender with no mass felt.  Pap reflex done.  Breast exam normal.  Last mammogram June 2020 negative.  We will follow-up here for fasting health labs. - CBC; Future - Comp Met (CMET); Future - Lipid panel; Future - TSH; Future - VITAMIN D 25 Hydroxy (Vit-D Deficiency, Fractures); Future  2. Encounter for surveillance of contraceptive pills Well on continuous birth control pill with a least 1/20.  No contraindication to continue.  Represcribed.  3. Vaginal discharge Bacterial vaginosis confirmed by wet prep.  Will treat with tinidazole 2 tablets twice a day for 2 days.  Usage reviewed and prescription sent to pharmacy. - WET PREP FOR [place], YEAST, CLUE  4. Severe endometriosis Severe endometriosis very well controlled on continuous birth control pills.  Will continue with continuous birth control pill.  Decision to repeat a pelvic ultrasound given the large left ovarian mass measuring 7.4 cm on pelvic ultrasound June 2019. - US Transvaginal Non-OB; Future  5. Endometrial cystoma of left ovary As above. - US Transvaginal Non-OB; Future  6. Class 1 obesity due to excess calories without serious comorbidity with body mass index (BMI) of 34.0 to 34.9 in adult Recommend a lower calorie/carb diet such as [employer].  Aerobic physical activities 5 times a week and weightlifting every 2 days.  Other orders - levonorgestrel-ethinyl estradiol (ALESSE) 0.1-20 MG-MCG tablet; TAKE ACTIVE PILL DAILY WITH 5 DAYS OFF IF STARTS SPOTTING - tinidazole (TINDAMAX) 500 MG tablet; Take 4 tablets (2,000 mg total) by mouth daily for 2 days.  [place], it was a pleasure seeing you today!  I will inform you of your results as soon as they are available.

## 2019-02-09 LAB — COMPREHENSIVE METABOLIC PANEL
AG Ratio: 1.3 (calc) (ref 1.0–2.5)
ALT: 7 U/L (ref 6–29)
AST: 13 U/L (ref 10–35)
Albumin: 3.6 g/dL (ref 3.6–5.1)
Alkaline phosphatase (APISO): 51 U/L (ref 31–125)
BUN: 7 mg/dL (ref 7–25)
CO2: 25 mmol/L (ref 20–32)
Calcium: 8.8 mg/dL (ref 8.6–10.2)
Chloride: 106 mmol/L (ref 98–110)
Creat: 0.73 mg/dL (ref 0.50–1.10)
Globulin: 2.8 g/dL (calc) (ref 1.9–3.7)
Glucose, Bld: 83 mg/dL (ref 65–99)
Potassium: 3.6 mmol/L (ref 3.5–5.3)
Sodium: 139 mmol/L (ref 135–146)
Total Bilirubin: 0.6 mg/dL (ref 0.2–1.2)
Total Protein: 6.4 g/dL (ref 6.1–8.1)

## 2019-02-09 LAB — CBC
HCT: 32.9 % — ABNORMAL LOW (ref 35.0–45.0)
Hemoglobin: 10.8 g/dL — ABNORMAL LOW (ref 11.7–15.5)
MCH: 28 pg (ref 27.0–33.0)
MCHC: 32.8 g/dL (ref 32.0–36.0)
MCV: 85.2 fL (ref 80.0–100.0)
MPV: 9.3 fL (ref 7.5–12.5)
Platelets: 400 10*3/uL (ref 140–400)
RBC: 3.86 10*6/uL (ref 3.80–5.10)
RDW: 13.8 % (ref 11.0–15.0)
WBC: 5.7 10*3/uL (ref 3.8–10.8)

## 2019-02-09 LAB — VITAMIN D 25 HYDROXY (VIT D DEFICIENCY, FRACTURES): Vit D, 25-Hydroxy: 24 ng/mL — ABNORMAL LOW (ref 30–100)

## 2019-02-09 LAB — PAP IG W/ RFLX HPV ASCU

## 2019-02-09 LAB — LIPID PANEL
Cholesterol: 134 mg/dL (ref <200)
HDL: 44 mg/dL — ABNORMAL LOW (ref >=50)
LDL Cholesterol (Calc): 78 mg/dL (calc)
Non-HDL Cholesterol (Calc): 90 mg/dL (calc) (ref <130)
Total CHOL/HDL Ratio: 3 (calc) (ref <5.0)
Triglycerides: 50 mg/dL (ref <150)

## 2019-02-09 LAB — TSH: TSH: 1.18 mIU/L

## 2019-03-15 ENCOUNTER — Other Ambulatory Visit: Payer: Self-pay

## 2019-03-16 ENCOUNTER — Encounter: Payer: Self-pay | Admitting: Obstetrics & Gynecology

## 2019-03-16 ENCOUNTER — Ambulatory Visit (INDEPENDENT_AMBULATORY_CARE_PROVIDER_SITE_OTHER): Payer: 59

## 2019-03-16 ENCOUNTER — Ambulatory Visit (INDEPENDENT_AMBULATORY_CARE_PROVIDER_SITE_OTHER): Payer: 59 | Admitting: Obstetrics & Gynecology

## 2019-03-16 DIAGNOSIS — N80102 Endometriosis of left ovary, unspecified depth: Secondary | ICD-10-CM

## 2019-03-16 DIAGNOSIS — Z3041 Encounter for surveillance of contraceptive pills: Secondary | ICD-10-CM | POA: Diagnosis not present

## 2019-03-16 DIAGNOSIS — N801 Endometriosis of ovary: Secondary | ICD-10-CM | POA: Diagnosis not present

## 2019-03-16 DIAGNOSIS — N809 Endometriosis, unspecified: Secondary | ICD-10-CM | POA: Diagnosis not present

## 2019-03-16 NOTE — Patient Instructions (Signed)
1. Severe endometriosis Asymptomatic on Alesse birth control pills.  Pelvic ultrasound results reviewed with patient, left ovarian endometrioma is smaller than in July 2019.  Patient reassured.  Will observe.  2. Endometrial cystoma of left ovary Left ovarian cyst compatible with an endometrioma measured at 5.3 x 4.5 x 3.2 cm, smaller than on pelvic ultrasound in July 2019.  3. Encounter for surveillance of contraceptive pills Well on Alesse birth control pill, will continue.  Michelle Serrano, it was a pleasure seeing you today!

## 2019-03-16 NOTE — Progress Notes (Signed)
    Adam Schepps 06-Nov-1970 [id number]        48 y.o.  G2P0020   RP: Left ovarian endometrioma for pelvic ultrasound  HPI: Patient is completely asymptomatic with no pelvic pain.  Well on Alesse birth control pills.  Last pelvic ultrasound July 2019 showed a left ovarian cyst compatible with an endometrioma.   OB History  Gravida Para Term Preterm AB Living  2       2 0  SAB TAB Ectopic Multiple Live Births  2            # Outcome Date GA Lbr Len/2nd Weight Sex Delivery Anes PTL Lv  2 SAB           1 SAB             Past medical history,surgical history, problem list, medications, allergies, family history and social history were all reviewed and documented in the EPIC chart.   Directed ROS with pertinent positives and negatives documented in the history of present illness/assessment and plan.  Exam:  There were no vitals filed for this visit. General appearance:  Normal  Pelvic US today: Compared with previous pelvic ultrasound in July 2019.  T/V images.  Anteverted uterus measuring 6.82 x 5.57 x 3.97 cm.  Several small intramural fibroids measured at 1.3, 1.0, 0.9 and 0.8 cm.  Thin symmetrical endometrial lining measured at 2.4 mm with no mass.  Right ovary normal.  Adjacent hydrosalpinx measuring 5.4 x 1.1 cm.  Left adnexal mass slightly smaller than on July 2019 scan, measured today at 5.3 x 4.5 x 3.2 cm.  No free fluid in the posterior cul-de-sac.   Assessment/Plan:  48 y.o. G2P0020   1. Severe endometriosis Asymptomatic on Alesse birth control pills.  Pelvic ultrasound results reviewed with patient, left ovarian endometrioma is smaller than in July 2019.  Patient reassured.  Will observe.  2. Endometrial cystoma of left ovary Left ovarian cyst compatible with an endometrioma measured at 5.3 x 4.5 x 3.2 cm, smaller than on pelvic ultrasound in July 2019.  3. Encounter for surveillance of contraceptive pills Well on Alesse birth control pill, will continue.   Counseling on above issues and coordination of care more than 50% for 15 minutes.  [name] MD, 8:49 AM 03/16/2019

## 2019-04-11 ENCOUNTER — Encounter: Payer: Self-pay | Admitting: Gynecology

## 2019-09-22 ENCOUNTER — Ambulatory Visit: Payer: 59 | Attending: Internal Medicine

## 2019-09-22 DIAGNOSIS — Z23 Encounter for immunization: Secondary | ICD-10-CM | POA: Insufficient documentation

## 2019-09-22 NOTE — Progress Notes (Signed)
   Covid-19 Vaccination Clinic  Name:  Michelle Serrano    MRN: [id number] DOB: [birth year]  09/22/2019  Michelle Serrano was observed post Covid-19 immunization for 15 minutes without incident. She was provided with Vaccine Information Sheet and instruction to access the V-Safe system.   Michelle Serrano was instructed to call 911 with any severe reactions post vaccine: Marland Kitchen Difficulty breathing  . Swelling of face and throat  . A fast heartbeat  . A bad rash all over body  . Dizziness and weakness   Immunizations Administered    Name Date Dose VIS Date Route   Pfizer COVID-19 Vaccine 09/22/2019  5:51 PM 0.3 mL 06/30/2019 Intramuscular   Manufacturer: [place]   Lot: [id number]   [place]: [id number]

## 2019-10-24 ENCOUNTER — Ambulatory Visit: Payer: 59 | Attending: Internal Medicine

## 2019-10-24 DIAGNOSIS — Z23 Encounter for immunization: Secondary | ICD-10-CM

## 2019-10-24 NOTE — Progress Notes (Signed)
   Covid-19 Vaccination Clinic  Name:  Michelle Serrano    MRN: [id number] DOB: 1971/06/07  10/24/2019  Michelle Serrano was observed post Covid-19 immunization for 30 minutes based on pre-vaccination screening without incident. She was provided with Vaccine Information Sheet and instruction to access the V-Safe system.   Michelle Serrano was instructed to call 911 with any severe reactions post vaccine: Marland Kitchen Difficulty breathing  . Swelling of face and throat  . A fast heartbeat  . A bad rash all over body  . Dizziness and weakness   Immunizations Administered    Name Date Dose VIS Date Route   Pfizer COVID-19 Vaccine 10/24/2019  5:04 PM 0.3 mL 06/30/2019 Intramuscular   Manufacturer: [place]   Lot: [id number]   [place]: [id number]

## 2020-01-24 ENCOUNTER — Other Ambulatory Visit: Payer: Self-pay | Admitting: Obstetrics & Gynecology

## 2020-01-24 DIAGNOSIS — Z1231 Encounter for screening mammogram for malignant neoplasm of breast: Secondary | ICD-10-CM

## 2020-01-30 ENCOUNTER — Ambulatory Visit
Admission: RE | Admit: 2020-01-30 | Discharge: 2020-01-30 | Disposition: A | Discharge status: Home / Self Care | Payer: 59 | Source: Ambulatory Visit

## 2020-01-30 ENCOUNTER — Other Ambulatory Visit: Payer: Self-pay

## 2020-01-30 DIAGNOSIS — Z1231 Encounter for screening mammogram for malignant neoplasm of breast: Secondary | ICD-10-CM

## 2020-02-08 ENCOUNTER — Ambulatory Visit (INDEPENDENT_AMBULATORY_CARE_PROVIDER_SITE_OTHER): Payer: 59 | Admitting: Obstetrics & Gynecology

## 2020-02-08 ENCOUNTER — Other Ambulatory Visit: Payer: Self-pay

## 2020-02-08 ENCOUNTER — Encounter: Payer: Self-pay | Admitting: Obstetrics & Gynecology

## 2020-02-08 VITALS — BP 130/80 | Ht 66.0 in | Wt 225.0 lb

## 2020-02-08 DIAGNOSIS — Z01419 Encounter for gynecological examination (general) (routine) without abnormal findings: Secondary | ICD-10-CM

## 2020-02-08 DIAGNOSIS — Z3041 Encounter for surveillance of contraceptive pills: Secondary | ICD-10-CM

## 2020-02-08 DIAGNOSIS — Z6836 Body mass index (BMI) 36.0-36.9, adult: Secondary | ICD-10-CM

## 2020-02-08 DIAGNOSIS — N809 Endometriosis, unspecified: Secondary | ICD-10-CM | POA: Diagnosis not present

## 2020-02-08 DIAGNOSIS — E6609 Other obesity due to excess calories: Secondary | ICD-10-CM | POA: Diagnosis not present

## 2020-02-08 LAB — CBC
HCT: 34.9 % — ABNORMAL LOW (ref 35.0–45.0)
Hemoglobin: 11.2 g/dL — ABNORMAL LOW (ref 11.7–15.5)
MCH: 26.6 pg — ABNORMAL LOW (ref 27.0–33.0)
MCHC: 32.1 g/dL (ref 32.0–36.0)
MCV: 82.9 fL (ref 80.0–100.0)
MPV: 9.4 fL (ref 7.5–12.5)
Platelets: 426 10*3/uL — ABNORMAL HIGH (ref 140–400)
RBC: 4.21 10*6/uL (ref 3.80–5.10)
RDW: 13.6 % (ref 11.0–15.0)
WBC: 5.3 10*3/uL (ref 3.8–10.8)

## 2020-02-08 LAB — COMPREHENSIVE METABOLIC PANEL
AG Ratio: 1.2 (calc) (ref 1.0–2.5)
ALT: 7 U/L (ref 6–29)
AST: 13 U/L (ref 10–35)
Albumin: 3.7 g/dL (ref 3.6–5.1)
Alkaline phosphatase (APISO): 56 U/L (ref 31–125)
BUN/Creatinine Ratio: 7 (calc) (ref 6–22)
BUN: 6 mg/dL — ABNORMAL LOW (ref 7–25)
CO2: 27 mmol/L (ref 20–32)
Calcium: 8.6 mg/dL (ref 8.6–10.2)
Chloride: 107 mmol/L (ref 98–110)
Creat: 0.82 mg/dL (ref 0.50–1.10)
Globulin: 3.2 g/dL (calc) (ref 1.9–3.7)
Glucose, Bld: 82 mg/dL (ref 65–99)
Potassium: 3.5 mmol/L (ref 3.5–5.3)
Sodium: 137 mmol/L (ref 135–146)
Total Bilirubin: 0.6 mg/dL (ref 0.2–1.2)
Total Protein: 6.9 g/dL (ref 6.1–8.1)

## 2020-02-08 LAB — LIPID PANEL
Cholesterol: 130 mg/dL (ref <200)
HDL: 47 mg/dL — ABNORMAL LOW (ref >=50)
LDL Cholesterol (Calc): 70 mg/dL (calc)
Non-HDL Cholesterol (Calc): 83 mg/dL (calc) (ref <130)
Total CHOL/HDL Ratio: 2.8 (calc) (ref <5.0)
Triglycerides: 54 mg/dL (ref <150)

## 2020-02-08 LAB — TSH: TSH: 1.27 mIU/L

## 2020-02-08 LAB — VITAMIN D 25 HYDROXY (VIT D DEFICIENCY, FRACTURES): Vit D, 25-Hydroxy: 13 ng/mL — ABNORMAL LOW (ref 30–100)

## 2020-02-08 MED ORDER — LEVONORGESTREL-ETHINYL ESTRAD 0.1-20 MG-MCG PO TABS: ORAL_TABLET | ORAL | 4 refills | Status: DC

## 2020-02-08 NOTE — Addendum Note (Signed)
Addended by: Thurnell Garbe A on: 02/08/2020 10:27 AM   Modules accepted: Orders

## 2020-02-08 NOTE — Progress Notes (Signed)
Michelle Serrano [facility] 08-Jun-1971 [id number]   History:    49 y.o. G2P0A2 Married.  3 adopted teenagers.  RP:  Established patient presenting for annual gyn exam   HPI: Well on continuous BCPs with Alesse 1/20 for severe Endometriosis.  Aborted laparotomy for stage 4 Endometriosis with completely obliterated post cul-de-sac in 10/2016.  No breakthrough bleeding.  No increase in pelvic pain.  No worsening of  pain with intercourse.  Urine and bowel movements normal.  Breasts normal.  Body mass index 36.32.  Health labs here today.   Past medical history,surgical history, family history and social history were all reviewed and documented in the EPIC chart.  Gynecologic History Patient's last menstrual period was 10/07/2019.  Obstetric History OB History  Gravida Para Term Preterm AB Living  2       2 0  SAB TAB Ectopic Multiple Live Births  2            # Outcome Date GA Lbr Len/2nd Weight Sex Delivery Anes PTL Lv  2 SAB           1 SAB              ROS: A ROS was performed and pertinent positives and negatives are included in the history.  GENERAL: No fevers or chills. HEENT: No change in vision, no earache, sore throat or sinus congestion. NECK: No pain or stiffness. CARDIOVASCULAR: No chest pain or pressure. No palpitations. PULMONARY: No shortness of breath, cough or wheeze. GASTROINTESTINAL: No abdominal pain, nausea, vomiting or diarrhea, melena or bright red blood per rectum. GENITOURINARY: No urinary frequency, urgency, hesitancy or dysuria. MUSCULOSKELETAL: No joint or muscle pain, no back pain, no recent trauma. DERMATOLOGIC: No rash, no itching, no lesions. ENDOCRINE: No polyuria, polydipsia, no heat or cold intolerance. No recent change in weight. HEMATOLOGICAL: No anemia or easy bruising or bleeding. NEUROLOGIC: No headache, seizures, numbness, tingling or weakness. PSYCHIATRIC: No depression, no loss of interest in normal activity or change in sleep pattern.      Exam:   Ht 5' 6"  (1.676 m)   Wt 225 lb (102.1 kg)   LMP 10/07/2019 Comment: periods 4 x a year  BMI 36.32 kg/m   Body mass index is 36.32 kg/m.    02/08/20 9:17 AM    BP  130/80   Weight  225lb(102.1kg)   Height  5'6"(1.669m   Other Vitals  BMI 36.32 kg/m2  BSA 2.18 m2  LMP 10/07/2019       General appearance : Well developed well nourished female. No acute distress HEENT: Eyes: no retinal hemorrhage or exudates,  Neck supple, trachea midline, no carotid bruits, no thyroidmegaly Lungs: Clear to auscultation, no rhonchi or wheezes, or rib retractions  Heart: Regular rate and rhythm, no murmurs or gallops Breast:Examined in sitting and supine position were symmetrical in appearance, no palpable masses or tenderness,  no skin retraction, no nipple inversion, no nipple discharge, no skin discoloration, no axillary or supraclavicular lymphadenopathy Abdomen: no palpable masses or tenderness, no rebound or guarding Extremities: no edema or skin discoloration or tenderness  Pelvic: Vulva: Normal             Vagina: No gross lesions or discharge  Cervix: No gross lesions or discharge.  Pap reflex done.  Uterus  AV, normal size, shape and consistency, non-tender and mobile  Adnexae thick, mildly tender, stable.  Anus: Normal   Assessment/Plan:  49y.o. female for annual exam   1. Encounter  for routine gynecological examination with Papanicolaou smear of cervix Stable gynecologic exam with known severe endometriosis.  Pap reflex done.  Breast exam normal.  Screening mammogram July 2021 -.  Fasting health labs here today.  - CBC - Comp Met (CMET) - TSH - VITAMIN D 25 Hydroxy (Vit-D Deficiency, Fractures) - Lipid panel  2. Encounter for surveillance of contraceptive pills Well on continuous birth control pill with Alesse.  No contraindication to continue.  Prescription sent to pharmacy.  3. Severe endometriosis Satisfied and stable on continuous birth control  pills with Alesse 1/20.  Prescription sent to pharmacy.  Patient will call for reevaluation and a change in management if symptoms worsen.  4. Class 2 obesity due to excess calories without serious comorbidity with body mass index (BMI) of 36.0 to 36.9 in adult Recommend a lower calorie/carb diet such as [employer].  Aerobic activities 5 times a week and light weightlifting every 2 days.  Other orders - montelukast (SINGULAIR) 10 MG tablet; Take 10 mg by mouth at bedtime. - levonorgestrel-ethinyl estradiol (ALESSE) 0.1-20 MG-MCG tablet; TAKE ACTIVE PILL DAILY WITH 5 DAYS OFF IF STARTS SPOTTING  [name] MD, 9:19 AM 02/08/2020

## 2020-02-12 LAB — PAP IG W/ RFLX HPV ASCU

## 2020-02-15 DIAGNOSIS — E559 Vitamin D deficiency, unspecified: Secondary | ICD-10-CM

## 2020-02-19 MED ORDER — VITAMIN D (ERGOCALCIFEROL) 1.25 MG (50000 UNIT) PO CAPS: ORAL_CAPSULE | ORAL | 0 refills | Status: DC

## 2020-04-17 ENCOUNTER — Telehealth: Payer: Self-pay | Admitting: *Deleted

## 2020-04-17 NOTE — Telephone Encounter (Signed)
Patient called c/o BTB takes Alesse Continuous use due to endometriosis. Reports she has a cycle last month and noticed spotting this month, reports she stopped the pills on 04/14/20 to have a cycle per [facility] recommendations. I explained to her that is correct per note on Alesse Rx patient to stop for 5 days if BTB should occur, once off for 5 days may restart pills. I told patient to follow up if this continues. Patient reports she takes pills daily on time.

## 2020-05-06 ENCOUNTER — Other Ambulatory Visit: Payer: Self-pay | Admitting: Obstetrics & Gynecology

## 2020-10-07 ENCOUNTER — Other Ambulatory Visit: Payer: Self-pay | Admitting: Obstetrics & Gynecology

## 2020-10-07 DIAGNOSIS — Z1231 Encounter for screening mammogram for malignant neoplasm of breast: Secondary | ICD-10-CM

## 2020-10-28 DIAGNOSIS — J3089 Other allergic rhinitis: Secondary | ICD-10-CM | POA: Diagnosis not present

## 2020-10-28 DIAGNOSIS — J301 Allergic rhinitis due to pollen: Secondary | ICD-10-CM | POA: Diagnosis not present

## 2020-10-28 DIAGNOSIS — J3081 Allergic rhinitis due to animal (cat) (dog) hair and dander: Secondary | ICD-10-CM | POA: Diagnosis not present

## 2020-10-28 DIAGNOSIS — Z91013 Allergy to seafood: Secondary | ICD-10-CM | POA: Diagnosis not present

## 2020-11-25 ENCOUNTER — Telehealth: Payer: Self-pay | Admitting: *Deleted

## 2020-11-25 NOTE — Telephone Encounter (Signed)
Patient left voicemail on Triage line asking for call back in regards to [facility] prescription.   Message left to return call to [place] at [phone].

## 2020-11-25 NOTE — Telephone Encounter (Signed)
Returned call to patient. Patient states that she switched from [facility] to [facility] and manufacturer of birth control changed. Patient states she would like to stick with that manufacturer. RN advised would need to contact pharmacy directly to see what manufacturer supplies specific pharmacy. Patient agreeable. Patient states she will reach out to [facility] and will return call to office if needs new prescription.   Encounter closed.

## 2020-11-26 ENCOUNTER — Telehealth: Payer: Self-pay | Admitting: *Deleted

## 2020-11-26 MED ORDER — LEVONORGESTREL-ETHINYL ESTRAD 0.1-20 MG-MCG PO TABS: ORAL_TABLET | ORAL | 1 refills | Status: DC

## 2020-11-26 NOTE — Telephone Encounter (Signed)
Patient called back from conversation with Raquel Sarna on 11/25/20 stating she spoke with the pharmacy at [facility] and they told her to have the office send in [place]. [facility] also sent fax sheet asking for Sronyx 28 tablets. Rx sent.

## 2021-02-03 ENCOUNTER — Other Ambulatory Visit: Payer: Self-pay

## 2021-02-03 ENCOUNTER — Ambulatory Visit
Admission: RE | Admit: 2021-02-03 | Discharge: 2021-02-03 | Disposition: A | Discharge status: Home / Self Care | Payer: 59 | Source: Ambulatory Visit | Attending: Obstetrics & Gynecology | Admitting: Obstetrics & Gynecology

## 2021-02-03 DIAGNOSIS — Z1231 Encounter for screening mammogram for malignant neoplasm of breast: Secondary | ICD-10-CM | POA: Diagnosis not present

## 2021-02-12 ENCOUNTER — Other Ambulatory Visit: Payer: Self-pay

## 2021-02-12 ENCOUNTER — Ambulatory Visit (INDEPENDENT_AMBULATORY_CARE_PROVIDER_SITE_OTHER): Payer: BC Managed Care – PPO | Admitting: Obstetrics & Gynecology

## 2021-02-12 ENCOUNTER — Other Ambulatory Visit (HOSPITAL_COMMUNITY)
Admission: RE | Admit: 2021-02-12 | Discharge: 2021-02-12 | Disposition: A | Discharge status: Home / Self Care | Payer: BC Managed Care – PPO | Source: Ambulatory Visit | Attending: Obstetrics & Gynecology | Admitting: Obstetrics & Gynecology

## 2021-02-12 ENCOUNTER — Encounter: Payer: Self-pay | Admitting: Obstetrics & Gynecology

## 2021-02-12 VITALS — BP 118/80 | HR 72 | Resp 16 | Ht 66.25 in | Wt 229.0 lb

## 2021-02-12 DIAGNOSIS — E6609 Other obesity due to excess calories: Secondary | ICD-10-CM

## 2021-02-12 DIAGNOSIS — Z01419 Encounter for gynecological examination (general) (routine) without abnormal findings: Secondary | ICD-10-CM

## 2021-02-12 DIAGNOSIS — Z6836 Body mass index (BMI) 36.0-36.9, adult: Secondary | ICD-10-CM

## 2021-02-12 DIAGNOSIS — N809 Endometriosis, unspecified: Secondary | ICD-10-CM

## 2021-02-12 DIAGNOSIS — M62838 Other muscle spasm: Secondary | ICD-10-CM | POA: Diagnosis not present

## 2021-02-12 DIAGNOSIS — Z3041 Encounter for surveillance of contraceptive pills: Secondary | ICD-10-CM | POA: Diagnosis not present

## 2021-02-12 MED ORDER — LEVONORGESTREL-ETHINYL ESTRAD 0.1-20 MG-MCG PO TABS: ORAL_TABLET | ORAL | 4 refills | Status: DC

## 2021-02-12 MED ORDER — CHLORZOXAZONE 500 MG PO TABS: 250.0000 mg | ORAL_TABLET | Freq: Every day | ORAL | 1 refills | Status: DC | PRN

## 2021-02-12 NOTE — Progress Notes (Signed)
Michelle Serrano [facility] 1971/03/14 [id number]   History:    50 y.o. G2P0A2 Married.  3 adopted teenagers.   RP:  Established patient presenting for annual gyn exam   HPI: Well on continuous BCPs with Alesse 1/20 for severe Endometriosis.  Aborted laparotomy for stage 4 Endometriosis with completely obliterated post cul-de-sac in 10/2016.  No breakthrough bleeding.  No increase in pelvic pain.  No worsening of  pain with intercourse.  Urine and bowel movements normal.  Breasts normal.  Body mass index 36.68.  Health labs here today.   Past medical history,surgical history, family history and social history were all reviewed and documented in the EPIC chart.  Gynecologic History No LMP recorded. (Menstrual status: Oral contraceptives).  Obstetric History OB History  Gravida Para Term Preterm AB Living  2       2 0  SAB IAB Ectopic Multiple Live Births  2            # Outcome Date GA Lbr Len/2nd Weight Sex Delivery Anes PTL Lv  2 SAB           1 SAB              ROS: A ROS was performed and pertinent positives and negatives are included in the history.  GENERAL: No fevers or chills. HEENT: No change in vision, no earache, sore throat or sinus congestion. NECK: No pain or stiffness. CARDIOVASCULAR: No chest pain or pressure. No palpitations. PULMONARY: No shortness of breath, cough or wheeze. GASTROINTESTINAL: No abdominal pain, nausea, vomiting or diarrhea, melena or bright red blood per rectum. GENITOURINARY: No urinary frequency, urgency, hesitancy or dysuria. MUSCULOSKELETAL: No joint or muscle pain, no back pain, no recent trauma. DERMATOLOGIC: No rash, no itching, no lesions. ENDOCRINE: No polyuria, polydipsia, no heat or cold intolerance. No recent change in weight. HEMATOLOGICAL: No anemia or easy bruising or bleeding. NEUROLOGIC: No headache, seizures, numbness, tingling or weakness. PSYCHIATRIC: No depression, no loss of interest in normal activity or change in sleep pattern.      Exam:   BP 118/80   Pulse 72   Resp 16   Ht 5' 6.25" (1.683 m)   Wt 229 lb (103.9 kg)   BMI 36.68 kg/m   Body mass index is 36.68 kg/m.  General appearance : Well developed well nourished female. No acute distress HEENT: Eyes: no retinal hemorrhage or exudates,  Neck supple, trachea midline, no carotid bruits, no thyroidmegaly Lungs: Clear to auscultation, no rhonchi or wheezes, or rib retractions  Heart: Regular rate and rhythm, no murmurs or gallops Breast:Examined in sitting and supine position were symmetrical in appearance, no palpable masses or tenderness,  no skin retraction, no nipple inversion, no nipple discharge, no skin discoloration, no axillary or supraclavicular lymphadenopathy Abdomen: no palpable masses or tenderness, no rebound or guarding Extremities: no edema or skin discoloration or tenderness  Pelvic: Vulva: Normal             Vagina: No gross lesions or discharge  Cervix: No gross lesions or discharge.  Pap reflex done.  Uterus  AV, normal size, shape and consistency, non-tender and mobile  Adnexa  Without masses or tenderness  Anus: Normal   Assessment/Plan:  50 y.o. female for annual exam   1. Encounter for routine gynecological examination with Papanicolaou smear of cervix Normal gynecologic exam.  Pap reflex done.  Breast exam normal.  Screening mammogram July 2022 was negative.  Fasting health labs here today. - CBC -  Comp Met (CMET) - TSH - Lipid Profile - Vitamin D 1,25 dihydroxy - Cytology - PAP( [place])  2. Encounter for surveillance of contraceptive pills Well on Alesse 1/20 continuous use for endometriosis.  No contraindication to continue.  Prescription sent to pharmacy.  3. Severe endometriosis Symptoms controlled on continuous birth control pill.  We will continue.  4. Muscle spasm Counseling on muscle spasm given.  Chlorzoxazone represcribed as needed.  5. Class 2 obesity due to excess calories without serious  comorbidity with body mass index (BMI) of 36.0 to 36.9 in adult Recommend a lower calorie/carb diet.  Aerobic activities 5 times a week and light weightlifting every 2 days.  Other orders - EPINEPHrine 0.3 mg/0.3 mL IJ SOAJ injection; SMARTSIG:0.3 Milliliter(s) IM Once PRN - SYMBICORT 160-4.5 MCG/ACT inhaler; SMARTSIG:2 Puff(s) By Mouth Twice Daily - azelastine (OPTIVAR) 0.05 % ophthalmic solution; 1 drop into affected eye - azelastine (ASTELIN) 0.1 % nasal spray; 1-2 puffs in each nostril - levonorgestrel-ethinyl estradiol (SRONYX) 0.1-20 MG-MCG tablet; TAKE ACTIVE PILL DAILY WITH 5 DAYS OFF IF STARTS SPOTTING - chlorzoxazone (PARAFON) 500 MG tablet; Take 0.5 tablets (250 mg total) by mouth daily as needed for muscle spasms.   Marie-Lyne Lavoie MD, 8:51 AM 02/12/2021    

## 2021-02-16 ENCOUNTER — Encounter: Payer: Self-pay | Admitting: Obstetrics & Gynecology

## 2021-02-16 LAB — COMPREHENSIVE METABOLIC PANEL
AG Ratio: 1.2 (calc) (ref 1.0–2.5)
ALT: 7 U/L (ref 6–29)
AST: 12 U/L (ref 10–35)
Albumin: 3.8 g/dL (ref 3.6–5.1)
Alkaline phosphatase (APISO): 64 U/L (ref 31–125)
BUN: 8 mg/dL (ref 7–25)
CO2: 26 mmol/L (ref 20–32)
Calcium: 8.8 mg/dL (ref 8.6–10.2)
Chloride: 106 mmol/L (ref 98–110)
Creat: 0.88 mg/dL (ref 0.50–0.99)
Globulin: 3.2 g/dL (calc) (ref 1.9–3.7)
Glucose, Bld: 83 mg/dL (ref 65–99)
Potassium: 4 mmol/L (ref 3.5–5.3)
Sodium: 139 mmol/L (ref 135–146)
Total Bilirubin: 0.6 mg/dL (ref 0.2–1.2)
Total Protein: 7 g/dL (ref 6.1–8.1)

## 2021-02-16 LAB — CBC
HCT: 37.1 % (ref 35.0–45.0)
Hemoglobin: 12.1 g/dL (ref 11.7–15.5)
MCH: 28 pg (ref 27.0–33.0)
MCHC: 32.6 g/dL (ref 32.0–36.0)
MCV: 85.9 fL (ref 80.0–100.0)
MPV: 9.2 fL (ref 7.5–12.5)
Platelets: 396 10*3/uL (ref 140–400)
RBC: 4.32 10*6/uL (ref 3.80–5.10)
RDW: 13.8 % (ref 11.0–15.0)
WBC: 5.7 10*3/uL (ref 3.8–10.8)

## 2021-02-16 LAB — LIPID PANEL
Cholesterol: 152 mg/dL (ref <200)
HDL: 52 mg/dL (ref >=50)
LDL Cholesterol (Calc): 86 mg/dL (calc)
Non-HDL Cholesterol (Calc): 100 mg/dL (calc) (ref <130)
Total CHOL/HDL Ratio: 2.9 (calc) (ref <5.0)
Triglycerides: 58 mg/dL (ref <150)

## 2021-02-16 LAB — VITAMIN D 1,25 DIHYDROXY
Vitamin D 1, 25 (OH)2 Total: 71 pg/mL (ref 18–72)
Vitamin D2 1, 25 (OH)2: 12 pg/mL
Vitamin D3 1, 25 (OH)2: 59 pg/mL

## 2021-02-16 LAB — TSH: TSH: 2.19 mIU/L

## 2021-02-17 LAB — CYTOLOGY - PAP
Comment: NEGATIVE
Diagnosis: UNDETERMINED — AB
High risk HPV: NEGATIVE

## 2021-03-21 DIAGNOSIS — M25562 Pain in left knee: Secondary | ICD-10-CM | POA: Diagnosis not present

## 2021-04-21 DIAGNOSIS — J069 Acute upper respiratory infection, unspecified: Secondary | ICD-10-CM | POA: Diagnosis not present

## 2021-05-30 DIAGNOSIS — J3089 Other allergic rhinitis: Secondary | ICD-10-CM | POA: Diagnosis not present

## 2021-05-30 DIAGNOSIS — R059 Cough, unspecified: Secondary | ICD-10-CM | POA: Diagnosis not present

## 2021-05-30 DIAGNOSIS — Z91013 Allergy to seafood: Secondary | ICD-10-CM | POA: Diagnosis not present

## 2021-05-30 DIAGNOSIS — J301 Allergic rhinitis due to pollen: Secondary | ICD-10-CM | POA: Diagnosis not present

## 2021-05-30 DIAGNOSIS — J3081 Allergic rhinitis due to animal (cat) (dog) hair and dander: Secondary | ICD-10-CM | POA: Diagnosis not present

## 2021-07-07 DIAGNOSIS — M5136 Other intervertebral disc degeneration, lumbar region: Secondary | ICD-10-CM | POA: Diagnosis not present

## 2021-07-25 DIAGNOSIS — M545 Low back pain, unspecified: Secondary | ICD-10-CM | POA: Diagnosis not present

## 2021-08-04 DIAGNOSIS — M5136 Other intervertebral disc degeneration, lumbar region: Secondary | ICD-10-CM | POA: Diagnosis not present

## 2021-09-23 ENCOUNTER — Telehealth: Payer: Self-pay | Admitting: *Deleted

## 2021-09-23 NOTE — Telephone Encounter (Signed)
Patient called left message in triage voicemail c/o vaginal discharge. Message sent to appointments to schedule office visit with provider.  ?

## 2021-09-23 NOTE — Telephone Encounter (Signed)
Patient scheduled on 09/26/21 ?

## 2021-09-26 ENCOUNTER — Ambulatory Visit: Payer: BC Managed Care – PPO | Admitting: Obstetrics & Gynecology

## 2021-11-27 ENCOUNTER — Other Ambulatory Visit: Payer: Self-pay | Admitting: Obstetrics & Gynecology

## 2021-11-27 DIAGNOSIS — Z1231 Encounter for screening mammogram for malignant neoplasm of breast: Secondary | ICD-10-CM

## 2022-02-06 ENCOUNTER — Ambulatory Visit
Admission: RE | Admit: 2022-02-06 | Discharge: 2022-02-06 | Disposition: A | Discharge status: Home / Self Care | Payer: BC Managed Care – PPO | Source: Ambulatory Visit | Attending: Obstetrics & Gynecology | Admitting: Obstetrics & Gynecology

## 2022-02-06 DIAGNOSIS — Z1231 Encounter for screening mammogram for malignant neoplasm of breast: Secondary | ICD-10-CM | POA: Diagnosis not present

## 2022-02-17 ENCOUNTER — Ambulatory Visit: Payer: BC Managed Care – PPO | Admitting: Obstetrics & Gynecology

## 2022-02-23 ENCOUNTER — Ambulatory Visit (INDEPENDENT_AMBULATORY_CARE_PROVIDER_SITE_OTHER): Payer: BC Managed Care – PPO | Admitting: Obstetrics & Gynecology

## 2022-02-23 ENCOUNTER — Encounter: Payer: Self-pay | Admitting: Obstetrics & Gynecology

## 2022-02-23 ENCOUNTER — Other Ambulatory Visit (HOSPITAL_COMMUNITY)
Admission: RE | Admit: 2022-02-23 | Discharge: 2022-02-23 | Disposition: A | Discharge status: Home / Self Care | Payer: BC Managed Care – PPO | Source: Ambulatory Visit | Attending: Obstetrics & Gynecology | Admitting: Obstetrics & Gynecology

## 2022-02-23 VITALS — BP 136/84 | HR 80 | Resp 14 | Ht 66.0 in | Wt 230.0 lb

## 2022-02-23 DIAGNOSIS — Z3041 Encounter for surveillance of contraceptive pills: Secondary | ICD-10-CM

## 2022-02-23 DIAGNOSIS — Z01419 Encounter for gynecological examination (general) (routine) without abnormal findings: Secondary | ICD-10-CM | POA: Diagnosis not present

## 2022-02-23 DIAGNOSIS — Z6837 Body mass index (BMI) 37.0-37.9, adult: Secondary | ICD-10-CM | POA: Diagnosis not present

## 2022-02-23 DIAGNOSIS — E661 Drug-induced obesity: Secondary | ICD-10-CM | POA: Diagnosis not present

## 2022-02-23 DIAGNOSIS — R87610 Atypical squamous cells of undetermined significance on cytologic smear of cervix (ASC-US): Secondary | ICD-10-CM | POA: Insufficient documentation

## 2022-02-23 DIAGNOSIS — E559 Vitamin D deficiency, unspecified: Secondary | ICD-10-CM | POA: Diagnosis not present

## 2022-02-23 MED ORDER — LEVONORGESTREL-ETHINYL ESTRAD 0.1-20 MG-MCG PO TABS: ORAL_TABLET | ORAL | 4 refills | Status: DC

## 2022-02-23 NOTE — Progress Notes (Signed)
Michelle Serrano [facility] 05-12-1971 [id number]   History:    51 y.o. G2P0A2 Married.  3 adopted teenagers.   RP:  Established patient presenting for annual gyn exam   HPI: Well on continuous BCPs with Alesse 1/20 for severe Endometriosis.  Aborted laparotomy for stage 4 Endometriosis with completely obliterated post cul-de-sac in 10/2016.  No breakthrough bleeding.  No pelvic pain.  No pain with intercourse.  Pap ASCUS/HPV HR Neg 01/2021.  Pap reflex today.  Urine and bowel movements normal.  Breasts normal.  Mammo Neg 01/2022. Body mass index 37.12.  Health labs here today.  Will schedule Colono with Dr Collene Mares.    Past medical history,surgical history, family history and social history were all reviewed and documented in the EPIC chart.  Gynecologic History No LMP recorded. (Menstrual status: Oral contraceptives).  Obstetric History OB History  Gravida Para Term Preterm AB Living  2       2 0  SAB IAB Ectopic Multiple Live Births  2            # Outcome Date GA Lbr Len/2nd Weight Sex Delivery Anes PTL Lv  2 SAB           1 SAB              ROS: A ROS was performed and pertinent positives and negatives are included in the history. GENERAL: No fevers or chills. HEENT: No change in vision, no earache, sore throat or sinus congestion. NECK: No pain or stiffness. CARDIOVASCULAR: No chest pain or pressure. No palpitations. PULMONARY: No shortness of breath, cough or wheeze. GASTROINTESTINAL: No abdominal pain, nausea, vomiting or diarrhea, melena or bright red blood per rectum. GENITOURINARY: No urinary frequency, urgency, hesitancy or dysuria. MUSCULOSKELETAL: No joint or muscle pain, no back pain, no recent trauma. DERMATOLOGIC: No rash, no itching, no lesions. ENDOCRINE: No polyuria, polydipsia, no heat or cold intolerance. No recent change in weight. HEMATOLOGICAL: No anemia or easy bruising or bleeding. NEUROLOGIC: No headache, seizures, numbness, tingling or weakness. PSYCHIATRIC: No  depression, no loss of interest in normal activity or change in sleep pattern.     Exam:   BP 136/84 (BP Location: Left Arm, Patient Position: Sitting, Cuff Size: Large)   Pulse 80   Resp 14   Ht 5' 6" (1.676 m)   Wt 230 lb (104.3 kg)   BMI 37.12 kg/m   Body mass index is 37.12 kg/m.  General appearance : Well developed well nourished female. No acute distress HEENT: Eyes: no retinal hemorrhage or exudates,  Neck supple, trachea midline, no carotid bruits, no thyroidmegaly Lungs: Clear to auscultation, no rhonchi or wheezes, or rib retractions  Heart: Regular rate and rhythm, no murmurs or gallops Breast:Examined in sitting and supine position were symmetrical in appearance, no palpable masses or tenderness,  no skin retraction, no nipple inversion, no nipple discharge, no skin discoloration, no axillary or supraclavicular lymphadenopathy Abdomen: no palpable masses or tenderness, no rebound or guarding Extremities: no edema or skin discoloration or tenderness  Pelvic: Vulva: Normal             Vagina: No gross lesions or discharge  Cervix: No gross lesions or discharge.  Pap reflex done.  Uterus  AV, normal size, shape and consistency, non-tender and mobile  Adnexa  Without masses or tenderness  Anus: Normal   Assessment/Plan:  51 y.o. female for annual exam   1. Encounter for routine gynecological examination with Papanicolaou smear of cervix Well on  continuous BCPs with Alesse 1/20 for severe Endometriosis.  Aborted laparotomy for stage 4 Endometriosis with completely obliterated post cul-de-sac in 10/2016.  No breakthrough bleeding.  No pelvic pain.  No pain with intercourse.  Pap ASCUS/HPV HR Neg 01/2021.  Pap reflex today.  Urine and bowel movements normal.  Breasts normal.  Mammo Neg 01/2022. Body mass index 37.12.  Health labs here today.  Will schedule Colono with Dr Collene Mares. - Cytology - PAP( [facility]) - CBC - Comp Met (CMET) - Lipid Profile - TSH - Vitamin D (25  hydroxy)  2. ASCUS of cervix with negative high risk HPV Pap reflex done. - Cytology - PAP( [place])  3. Encounter for surveillance of contraceptive pills Well on continuous BCPs with Alesse 1/20 for severe Endometriosis.   4. Class 2 drug-induced obesity with serious comorbidity and body mass index (BMI) of 37.0 to 37.9 in adult Lower calorie/carb diet.  Increase fitness activities.  Other orders - levonorgestrel-ethinyl estradiol (SRONYX) 0.1-20 MG-MCG tablet; TAKE ACTIVE PILL DAILY WITH 5 DAYS OFF IF STARTS SPOTTING   [name] MD, 3:02 PM 02/23/2022

## 2022-02-24 LAB — CBC
HCT: 33.6 % — ABNORMAL LOW (ref 35.0–45.0)
Hemoglobin: 11.5 g/dL — ABNORMAL LOW (ref 11.7–15.5)
MCH: 29.1 pg (ref 27.0–33.0)
MCHC: 34.2 g/dL (ref 32.0–36.0)
MCV: 85.1 fL (ref 80.0–100.0)
MPV: 9.5 fL (ref 7.5–12.5)
Platelets: 386 10*3/uL (ref 140–400)
RBC: 3.95 10*6/uL (ref 3.80–5.10)
RDW: 13.5 % (ref 11.0–15.0)
WBC: 4.9 10*3/uL (ref 3.8–10.8)

## 2022-02-24 LAB — COMPREHENSIVE METABOLIC PANEL
AG Ratio: 1.3 (calc) (ref 1.0–2.5)
ALT: 7 U/L (ref 6–29)
AST: 11 U/L (ref 10–35)
Albumin: 3.6 g/dL (ref 3.6–5.1)
Alkaline phosphatase (APISO): 57 U/L (ref 31–125)
BUN: 7 mg/dL (ref 7–25)
CO2: 26 mmol/L (ref 20–32)
Calcium: 8.5 mg/dL — ABNORMAL LOW (ref 8.6–10.2)
Chloride: 108 mmol/L (ref 98–110)
Creat: 0.77 mg/dL (ref 0.50–0.99)
Globulin: 2.8 g/dL (calc) (ref 1.9–3.7)
Glucose, Bld: 81 mg/dL (ref 65–99)
Potassium: 4 mmol/L (ref 3.5–5.3)
Sodium: 141 mmol/L (ref 135–146)
Total Bilirubin: 0.4 mg/dL (ref 0.2–1.2)
Total Protein: 6.4 g/dL (ref 6.1–8.1)

## 2022-02-24 LAB — LIPID PANEL
Cholesterol: 140 mg/dL (ref <200)
HDL: 52 mg/dL (ref >=50)
LDL Cholesterol (Calc): 75 mg/dL (calc)
Non-HDL Cholesterol (Calc): 88 mg/dL (calc) (ref <130)
Total CHOL/HDL Ratio: 2.7 (calc) (ref <5.0)
Triglycerides: 52 mg/dL (ref <150)

## 2022-02-24 LAB — TSH: TSH: 1.56 mIU/L

## 2022-02-24 LAB — VITAMIN D 25 HYDROXY (VIT D DEFICIENCY, FRACTURES): Vit D, 25-Hydroxy: 15 ng/mL — ABNORMAL LOW (ref 30–100)

## 2022-02-26 LAB — CYTOLOGY - PAP
Diagnosis: NEGATIVE
Diagnosis: REACTIVE

## 2022-02-28 ENCOUNTER — Other Ambulatory Visit: Payer: Self-pay | Admitting: Obstetrics & Gynecology

## 2022-03-02 ENCOUNTER — Telehealth: Payer: Self-pay

## 2022-03-02 NOTE — Telephone Encounter (Signed)
Patient had a visit 02/23/22. She said during the Pap smear she mentioned increased vaginal discharge. White, milky discharge. No odor or burning/itching. Just the discharge. She said she realized after the visit that it had not been addressed. She asked if anything you can recommend or prescribe.

## 2022-03-02 NOTE — Telephone Encounter (Signed)
Annual exam was 02/23/22 Last Rx prescribed 01/2021 "4. Muscle spasm Counseling on muscle spasm given.  Chlorzoxazone represcribed as needed.

## 2022-03-03 ENCOUNTER — Other Ambulatory Visit: Payer: Self-pay

## 2022-03-03 DIAGNOSIS — E559 Vitamin D deficiency, unspecified: Secondary | ICD-10-CM

## 2022-03-03 MED ORDER — VITAMIN D (ERGOCALCIFEROL) 1.25 MG (50000 UNIT) PO CAPS: 50000.0000 [IU] | ORAL_CAPSULE | ORAL | 0 refills | Status: DC

## 2022-03-09 DIAGNOSIS — Z0000 Encounter for general adult medical examination without abnormal findings: Secondary | ICD-10-CM | POA: Diagnosis not present

## 2022-03-09 DIAGNOSIS — Z23 Encounter for immunization: Secondary | ICD-10-CM | POA: Diagnosis not present

## 2022-03-11 NOTE — Telephone Encounter (Signed)
[name], MD  You 22 minutes ago (10:57 AM)   She can come for a Wet prep.  We need to test given very little symptom otherwise.     I called patient and per DPR access note on file recommended OV and relayed Dr. Mariah Milling note.

## 2022-03-30 ENCOUNTER — Other Ambulatory Visit: Payer: Self-pay | Admitting: Obstetrics & Gynecology

## 2022-03-30 NOTE — Telephone Encounter (Signed)
Rx request received for Sronyx from [facility].  Rx previously sent to [facility] on 02/23/22.   Rx completed as previously prescribed.   AEX 02/23/22 MMG 02/06/22, BiRads 1 neg  Routing to Dr. Dellis Filbert   Encounter closed.

## 2022-04-27 DIAGNOSIS — R059 Cough, unspecified: Secondary | ICD-10-CM | POA: Diagnosis not present

## 2022-04-27 DIAGNOSIS — R0982 Postnasal drip: Secondary | ICD-10-CM | POA: Diagnosis not present

## 2022-04-27 DIAGNOSIS — U071 COVID-19: Secondary | ICD-10-CM | POA: Diagnosis not present

## 2022-04-27 DIAGNOSIS — H6690 Otitis media, unspecified, unspecified ear: Secondary | ICD-10-CM | POA: Diagnosis not present

## 2022-04-27 DIAGNOSIS — H9203 Otalgia, bilateral: Secondary | ICD-10-CM | POA: Diagnosis not present

## 2022-05-25 ENCOUNTER — Other Ambulatory Visit: Payer: BC Managed Care – PPO

## 2022-05-29 DIAGNOSIS — Z91030 Bee allergy status: Secondary | ICD-10-CM | POA: Diagnosis not present

## 2022-05-29 DIAGNOSIS — H1045 Other chronic allergic conjunctivitis: Secondary | ICD-10-CM | POA: Diagnosis not present

## 2022-05-29 DIAGNOSIS — R058 Other specified cough: Secondary | ICD-10-CM | POA: Diagnosis not present

## 2022-05-29 DIAGNOSIS — J309 Allergic rhinitis, unspecified: Secondary | ICD-10-CM | POA: Diagnosis not present

## 2022-05-29 DIAGNOSIS — Z91013 Allergy to seafood: Secondary | ICD-10-CM | POA: Diagnosis not present

## 2022-07-21 ENCOUNTER — Ambulatory Visit: Payer: BC Managed Care – PPO | Admitting: Obstetrics & Gynecology

## 2022-07-21 ENCOUNTER — Encounter: Payer: Self-pay | Admitting: Obstetrics & Gynecology

## 2022-07-21 VITALS — BP 124/80 | HR 74 | Resp 16

## 2022-07-21 DIAGNOSIS — N898 Other specified noninflammatory disorders of vagina: Secondary | ICD-10-CM | POA: Diagnosis not present

## 2022-07-21 DIAGNOSIS — N760 Acute vaginitis: Secondary | ICD-10-CM

## 2022-07-21 LAB — WET PREP FOR TRICH, YEAST, CLUE

## 2022-07-21 MED ORDER — TINIDAZOLE 500 MG PO TABS: 1000.0000 mg | ORAL_TABLET | Freq: Two times a day (BID) | ORAL | 0 refills | Status: AC

## 2022-07-21 NOTE — Progress Notes (Signed)
    Michelle Serrano January 02, 1971 [id number]        52 y.o.  G2P0020 Married  RP: Vaginal discharge  HPI: Vaginal discharge on-off for a long time.  Well on BCPs.  No BTB.  No itchiness, no odor.  Declines STI screen.  No pelvic pain.  No fever.  Urine/BMs normal.   OB History  Gravida Para Term Preterm AB Living  2       2 0  SAB IAB Ectopic Multiple Live Births  2            # Outcome Date GA Lbr Len/2nd Weight Sex Delivery Anes PTL Lv  2 SAB           1 SAB             Past medical history,surgical history, problem list, medications, allergies, family history and social history were all reviewed and documented in the EPIC chart.   Directed ROS with pertinent positives and negatives documented in the history of present illness/assessment and plan.  Exam:  Vitals:   07/21/22 1018  BP: 124/80  Pulse: 74  Resp: 16   General appearance:  Normal  Abdomen: Normal  Gynecologic exam: Vulva normal.  Speculum:  Cervix/vagina normal.  Mild vaginal discharge.  Wet prep done.  Wet prep:  Many bacteria and WBCs.   Assessment/Plan:  52 y.o. G2P0020   1. Vaginal discharge Vaginal discharge on-off for a long time.  Well on BCPs.  No BTB.  No itchiness, no odor.  Declines STI screen.  No pelvic pain.  No fever.  Urine/BMs normal.  Mild vaginal discharge on exam.  Wet prep showing many bacteria.  Declines STI screen. Decision to cover with Tinidazole.  Usage reviewed, prescription sent to pharmacy. - WET PREP FOR TRICH, YEAST, CLUE  - tinidazole (TINDAMAX) 500 MG tablet; Take 2 tablets (1,000 mg total) by mouth 2 (two) times daily for 2 days.   [name] MD, 10:40 AM 07/21/2022

## 2022-12-28 ENCOUNTER — Ambulatory Visit: Payer: BC Managed Care – PPO | Admitting: Obstetrics & Gynecology

## 2022-12-28 ENCOUNTER — Encounter: Payer: Self-pay | Admitting: Obstetrics & Gynecology

## 2022-12-28 VITALS — BP 124/82 | HR 74 | Resp 16

## 2022-12-28 DIAGNOSIS — N760 Acute vaginitis: Secondary | ICD-10-CM

## 2022-12-28 DIAGNOSIS — N898 Other specified noninflammatory disorders of vagina: Secondary | ICD-10-CM

## 2022-12-28 LAB — WET PREP FOR TRICH, YEAST, CLUE

## 2022-12-28 MED ORDER — TINIDAZOLE 500 MG PO TABS: 1000.0000 mg | ORAL_TABLET | Freq: Two times a day (BID) | ORAL | 0 refills | Status: AC

## 2022-12-28 NOTE — Progress Notes (Signed)
    Shenaya Lebo Nov 29, 1970 [id number]        52 y.o.  G2P0A2 Married.  RP: Clear/white vaginal discharge  HPI: Clear/white vaginal discharge for a few months.  No vaginal odor, no itching.  No pelvic pain.  No fever.  Urine/BMs normal.   OB History  Gravida Para Term Preterm AB Living  2       2 0  SAB IAB Ectopic Multiple Live Births  2            # Outcome Date GA Lbr Len/2nd Weight Sex Delivery Anes PTL Lv  2 SAB           1 SAB             Past medical history,surgical history, problem list, medications, allergies, family history and social history were all reviewed and documented in the EPIC chart.   Directed ROS with pertinent positives and negatives documented in the history of present illness/assessment and plan.  Exam:  Vitals:   12/28/22 1344  BP: 124/82  Pulse: 74  Resp: 16   General appearance:  Normal  Abdomen: Normal  Gynecologic exam: Vulva normal.  Speculum:  Cervix/Vagina normal.  Increased vaginal discharge, whitish, bubbly.  Wet prep and SureSwab done.  Wet prep:  Clue cells present   Assessment/Plan:  52 y.o. G2P0020   1. Vaginal discharge Clear/white vaginal discharge for a few months.  No vaginal odor, no itching.  No pelvic pain.  No fever.  Urine/BMs normal. Bacterial vaginosis confirmed by wet prep.  Will treat with Tinidazole.  No CI.  Usage reviewed and prescription sent to pharmacy. - WET PREP FOR TRICH, YEAST, CLUE - SureSwab Advanced Vaginitis Plus,TMA  Other orders - tinidazole (TINDAMAX) 500 MG tablet; Take 2 tablets (1,000 mg total) by mouth 2 (two) times daily for 2 days.   Genia Del MD, 1:48 PM 12/28/2022

## 2022-12-29 LAB — SURESWAB® ADVANCED VAGINITIS PLUS,TMA
C. trachomatis RNA, TMA: NOT DETECTED
CANDIDA SPECIES: NOT DETECTED
Candida glabrata: NOT DETECTED
N. gonorrhoeae RNA, TMA: NOT DETECTED
SURESWAB(R) ADV BACTERIAL VAGINOSIS(BV),TMA: POSITIVE — AB
TRICHOMONAS VAGINALIS (TV),TMA: NOT DETECTED

## 2023-01-18 ENCOUNTER — Telehealth: Payer: Self-pay

## 2023-01-18 DIAGNOSIS — N898 Other specified noninflammatory disorders of vagina: Secondary | ICD-10-CM

## 2023-01-18 NOTE — Telephone Encounter (Signed)
Pt LVM stating was seen on 12/28/22 for d/c. Denied itching/irritation, odor, urine sxs, pelvic pain. WNL bowel movements.  Wetprep/Sureswab BV+ (Txd w/ tinidazole).  Pt states sxs resolved for a few days but has been back consistently since weds/thurs of last week. No change in sxs.   Please advise.

## 2023-01-19 MED ORDER — METRONIDAZOLE 0.75 % VA GEL: 1.0000 | Freq: Every day | VAGINAL | 0 refills | Status: AC

## 2023-01-19 MED ORDER — FLUCONAZOLE 150 MG PO TABS: 150.0000 mg | ORAL_TABLET | Freq: Once | ORAL | 0 refills | Status: AC

## 2023-01-19 NOTE — Telephone Encounter (Signed)
Per ML: "Please send Metrogel vaginally x 5 days.  Followed by Fluconazole 150 mg x 1 tab. Dr Seymour Bars"   Pt notified and voiced understanding. Rxs sent. Will route to provider for final review and close.

## 2023-02-01 ENCOUNTER — Telehealth: Payer: Self-pay

## 2023-02-01 NOTE — Telephone Encounter (Signed)
Per ML: "No need if no itching. If has yeast Sxs, can send. Dr L"   Pt notified and voiced understanding. Will close encounter.

## 2023-02-01 NOTE — Telephone Encounter (Signed)
Pt LVM in triage line stating she was prescribed the metrogel for persistent BV sxs.   Was also prescribed one fluconazole to take after gel was completed. Pt called to report she had lost/thrown away fluconazole tab w/ pharmacy instructions/boxes. Was wondering if this was something she even needed and if so, could we resend another one in? Please advise.

## 2023-02-10 ENCOUNTER — Other Ambulatory Visit: Payer: Self-pay | Admitting: Obstetrics & Gynecology

## 2023-02-10 DIAGNOSIS — Z1231 Encounter for screening mammogram for malignant neoplasm of breast: Secondary | ICD-10-CM

## 2023-02-26 ENCOUNTER — Ambulatory Visit
Admission: RE | Admit: 2023-02-26 | Discharge: 2023-02-26 | Disposition: A | Discharge status: Home / Self Care | Payer: BC Managed Care – PPO | Source: Ambulatory Visit | Attending: Obstetrics & Gynecology | Admitting: Obstetrics & Gynecology

## 2023-02-26 DIAGNOSIS — Z1231 Encounter for screening mammogram for malignant neoplasm of breast: Secondary | ICD-10-CM

## 2023-03-01 ENCOUNTER — Other Ambulatory Visit: Payer: Self-pay | Admitting: Obstetrics & Gynecology

## 2023-03-02 NOTE — Telephone Encounter (Signed)
Med refill request: Sronyx 0.1 -20 mg-mcg tab; TAKE ACTIVE 1 TABLET DAILY WITH 5 DAYS OFF IF STARTS SPOTTING  Last AEX: 02/23/22 -ML Next AEX: 03/16/23 -TW Last MMG (if hormonal med) 02/26/23 BiRads 1 neg  Last Rx sent 03/30/22 #112/3RF  Rx refused. Needs AEX  Routing to provider FYI.  Encounter closed.

## 2023-03-09 ENCOUNTER — Encounter: Payer: Self-pay | Admitting: Obstetrics and Gynecology

## 2023-03-15 DIAGNOSIS — Z131 Encounter for screening for diabetes mellitus: Secondary | ICD-10-CM | POA: Diagnosis not present

## 2023-03-15 DIAGNOSIS — Z13220 Encounter for screening for lipoid disorders: Secondary | ICD-10-CM | POA: Diagnosis not present

## 2023-03-15 DIAGNOSIS — E559 Vitamin D deficiency, unspecified: Secondary | ICD-10-CM | POA: Diagnosis not present

## 2023-03-15 DIAGNOSIS — Z0000 Encounter for general adult medical examination without abnormal findings: Secondary | ICD-10-CM | POA: Diagnosis not present

## 2023-03-16 ENCOUNTER — Other Ambulatory Visit (HOSPITAL_COMMUNITY)
Admission: RE | Admit: 2023-03-16 | Discharge: 2023-03-16 | Disposition: A | Discharge status: Home / Self Care | Payer: BC Managed Care – PPO | Source: Ambulatory Visit | Attending: Nurse Practitioner | Admitting: Nurse Practitioner

## 2023-03-16 ENCOUNTER — Encounter: Payer: Self-pay | Admitting: Nurse Practitioner

## 2023-03-16 ENCOUNTER — Ambulatory Visit (INDEPENDENT_AMBULATORY_CARE_PROVIDER_SITE_OTHER): Payer: BC Managed Care – PPO | Admitting: Nurse Practitioner

## 2023-03-16 VITALS — BP 126/74 | HR 66 | Ht 66.0 in | Wt 233.0 lb

## 2023-03-16 DIAGNOSIS — M62838 Other muscle spasm: Secondary | ICD-10-CM

## 2023-03-16 DIAGNOSIS — Z124 Encounter for screening for malignant neoplasm of cervix: Secondary | ICD-10-CM | POA: Diagnosis not present

## 2023-03-16 DIAGNOSIS — N898 Other specified noninflammatory disorders of vagina: Secondary | ICD-10-CM

## 2023-03-16 DIAGNOSIS — N809 Endometriosis, unspecified: Secondary | ICD-10-CM

## 2023-03-16 DIAGNOSIS — B3731 Acute candidiasis of vulva and vagina: Secondary | ICD-10-CM

## 2023-03-16 DIAGNOSIS — Z3041 Encounter for surveillance of contraceptive pills: Secondary | ICD-10-CM

## 2023-03-16 DIAGNOSIS — Z01419 Encounter for gynecological examination (general) (routine) without abnormal findings: Secondary | ICD-10-CM | POA: Diagnosis not present

## 2023-03-16 LAB — WET PREP FOR TRICH, YEAST, CLUE

## 2023-03-16 MED ORDER — LEVONORGESTREL-ETHINYL ESTRAD 0.1-20 MG-MCG PO TABS
ORAL_TABLET | ORAL | 3 refills | Status: DC
Start: 2023-03-16 — End: 2023-03-16

## 2023-03-16 MED ORDER — FLUCONAZOLE 150 MG PO TABS
150.0000 mg | ORAL_TABLET | ORAL | 0 refills | Status: DC
Start: 2023-03-16 — End: 2023-12-20

## 2023-03-16 MED ORDER — CHLORZOXAZONE 500 MG PO TABS: 500.0000 mg | ORAL_TABLET | Freq: Every day | ORAL | 1 refills | Status: AC | PRN

## 2023-03-16 MED ORDER — LEVONORGESTREL-ETHINYL ESTRAD 0.1-20 MG-MCG PO TABS
ORAL_TABLET | ORAL | 3 refills | Status: DC
Start: 2023-03-16 — End: 2023-10-11

## 2023-03-16 NOTE — Progress Notes (Signed)
Michelle Serrano [facility] [birth year] [id number]   History:  52 y.o. G2P0020 presents for annual exam. On COCs continuously for management of severe endometriosis. Laparotomy for stage 4 endometriosis aborted in 2018 due to completely obliterated post cul-de-sac. No BTB. 2022 ASCUS neg HR HPV, otherwise normal pap history. H/O myomectomy. Has noticed changes in vaginal discharge this past year. Treated for BV in June, otherwise testing has been negative. Denies itching or odor. H/O ovarian cyst/endometrioma, has decreased in size with COCs. Last U/S in 2020.  Gynecologic History Patient's last menstrual period was 03/08/2023.   Contraception/Family planning: OCP (estrogen/progesterone) Sexually active: Yes  Health Maintenance Last Pap: 02/23/2022. Results were: Normal Last mammogram: 02/26/2023. Results were: Normal Last colonoscopy: Never Last Dexa: Not indicated  Past medical history, past surgical history, family history and social history were all reviewed and documented in the EPIC chart. Married. Works in Data processing manager for [employer]. Working on An[employer].   ROS:  A ROS was performed and pertinent positives and negatives are included.  Exam:  Vitals:   03/16/23 0911  BP: 126/74  Pulse: 66  SpO2: 100%  Weight: 233 lb (105.7 kg)  Height: 5\' 6"  (1.676 m)   Body mass index is 37.61 kg/m.  General appearance:  Normal Thyroid:  Symmetrical, normal in size, without palpable masses or nodularity. Respiratory  Auscultation:  Clear without wheezing or rhonchi Cardiovascular  Auscultation:  Regular rate, without rubs, murmurs or gallops  Edema/varicosities:  Not grossly evident Abdominal  Soft,nontender, without masses, guarding or rebound.  Liver/spleen:  No organomegaly noted  Hernia:  None appreciated  Skin  Inspection:  Grossly normal Breasts: Examined lying and sitting.   Right: Without masses, retractions, nipple discharge or axillary adenopathy.   Left: Without masses,  retractions, nipple discharge or axillary adenopathy. Genitourinary   Inguinal/mons:  Normal without inguinal adenopathy  External genitalia:  Normal appearing vulva with no masses, tenderness, or lesions  BUS/Urethra/Skene's glands:  Normal  Vagina:  Normal appearing with normal color and discharge, no lesions  Cervix:  Normal appearing without discharge or lesions  Uterus:  Normal in size, shape and contour.  Midline and mobile, nontender  Adnexa/parametria:     Rt: Normal in size, without masses or tenderness.   Lt: Normal in size, without masses or tenderness.  Anus and perineum: Normal  Digital rectal exam: Deferred  Patient informed chaperone available to be present for breast and pelvic exam. Patient has requested no chaperone to be present. Patient has been advised what will be completed during breast and pelvic exam.   Wet prep + yeast  Assessment/Plan:  52 y.o. G2P0020 for annual exam.   Well female exam with routine gynecological exam - Education provided on SBEs, importance of preventative screenings, current guidelines, high calcium diet, regular exercise, and multivitamin daily. Labs with PCP.   Screening for cervical cancer - Plan: Cytology - PAP( [place]). 2022 ASCUS neg HPV, otherwise normal history. Pap today per guidelines.   Endometriosis - Plan: levonorgestrel-ethinyl estradiol (SRONYX) 0.1-20 MG-MCG tablet daily. Good management. Takes continuously.   Encounter for surveillance of contraceptive pills - Plan: levonorgestrel-ethinyl estradiol (SRONYX) 0.1-20 MG-MCG tablet daily.   Vaginal discharge - Plan: WET PREP FOR TRICH, YEAST, CLUE. + yeast  Muscle spasm - Plan: chlorzoxazone (PARAFON) 500 MG tablet daily as needed. Rare use.   Vaginal candidiasis - Plan: fluconazole (DIFLUCAN) 150 MG tablet today and repeat in 3 days for total of 2 doses.   Screening for breast cancer - Normal mammogram  history.  Continue annual screenings.  Normal breast exam  today.  Screening for colon cancer - Information provided by PCP. Plans to schedule soon.   Return in 1 year for annual or sooner if needed.      Olivia Mackie DNP, 9:22 AM 03/16/2023

## 2023-03-25 LAB — CYTOLOGY - PAP
Comment: NEGATIVE
Diagnosis: UNDETERMINED — AB
High risk HPV: NEGATIVE

## 2023-05-28 DIAGNOSIS — R058 Other specified cough: Secondary | ICD-10-CM | POA: Diagnosis not present

## 2023-05-28 DIAGNOSIS — H1045 Other chronic allergic conjunctivitis: Secondary | ICD-10-CM | POA: Diagnosis not present

## 2023-05-28 DIAGNOSIS — Z91030 Bee allergy status: Secondary | ICD-10-CM | POA: Diagnosis not present

## 2023-05-28 DIAGNOSIS — Z91013 Allergy to seafood: Secondary | ICD-10-CM | POA: Diagnosis not present

## 2023-08-20 DIAGNOSIS — J988 Other specified respiratory disorders: Secondary | ICD-10-CM | POA: Diagnosis not present

## 2023-08-20 DIAGNOSIS — R0602 Shortness of breath: Secondary | ICD-10-CM | POA: Diagnosis not present

## 2023-08-20 DIAGNOSIS — J45909 Unspecified asthma, uncomplicated: Secondary | ICD-10-CM | POA: Diagnosis not present

## 2023-10-04 DIAGNOSIS — Z91013 Allergy to seafood: Secondary | ICD-10-CM | POA: Diagnosis not present

## 2023-10-04 DIAGNOSIS — R058 Other specified cough: Secondary | ICD-10-CM | POA: Diagnosis not present

## 2023-10-04 DIAGNOSIS — Z91030 Bee allergy status: Secondary | ICD-10-CM | POA: Diagnosis not present

## 2023-10-04 DIAGNOSIS — H1045 Other chronic allergic conjunctivitis: Secondary | ICD-10-CM | POA: Diagnosis not present

## 2023-10-08 ENCOUNTER — Other Ambulatory Visit: Payer: Self-pay

## 2023-10-08 NOTE — Telephone Encounter (Signed)
 Pt called and left message requesting for a new OCP.  Previous brand Sronyx is no longer covered and is discontinued. She would like something very similar and sent [facility]. Please advise

## 2023-10-08 NOTE — Telephone Encounter (Signed)
 Forwarded to [facility] and TW for f/u

## 2023-10-08 NOTE — Telephone Encounter (Signed)
 TW patient, please forward to her.

## 2023-10-11 ENCOUNTER — Other Ambulatory Visit: Payer: Self-pay | Admitting: Nurse Practitioner

## 2023-10-11 DIAGNOSIS — Z3041 Encounter for surveillance of contraceptive pills: Secondary | ICD-10-CM

## 2023-10-11 MED ORDER — LEVONORGESTREL-ETHINYL ESTRAD 0.1-20 MG-MCG PO TABS: 1.0000 | ORAL_TABLET | Freq: Every day | ORAL | 1 refills | Status: DC

## 2023-10-11 NOTE — Telephone Encounter (Signed)
 New prescription sent

## 2023-10-11 NOTE — Telephone Encounter (Signed)
 Patient left message on triage line requesting update on refill request.   Spoke with patient. Advised per Tiffany.   Patient states Sronyx is being discontinued, states she is certain the generic levonorgestrel-ethinyl estradiol is covered by plan, requesting refill for 112 tabs with refills to [facility].

## 2023-10-11 NOTE — Telephone Encounter (Signed)
 Please have her call her insurance to see which similar birth controls will be covered. I am not sure if it is just that generic and if so, there are many options to choose from and want to make sure it will be covered.

## 2023-10-11 NOTE — Telephone Encounter (Signed)
 Patient notified. Encounter closed

## 2023-11-11 DIAGNOSIS — E559 Vitamin D deficiency, unspecified: Secondary | ICD-10-CM

## 2023-11-11 HISTORY — DX: Vitamin D deficiency, unspecified: E55.9

## 2023-11-12 ENCOUNTER — Encounter: Payer: Self-pay | Admitting: Cardiology

## 2023-11-12 ENCOUNTER — Ambulatory Visit: Attending: Cardiology | Admitting: Cardiology

## 2023-11-12 ENCOUNTER — Telehealth: Payer: Self-pay

## 2023-11-12 VITALS — BP 130/90 | HR 82 | Ht 67.0 in | Wt 176.4 lb

## 2023-11-12 DIAGNOSIS — R9431 Abnormal electrocardiogram [ECG] [EKG]: Secondary | ICD-10-CM

## 2023-11-12 DIAGNOSIS — Z01818 Encounter for other preprocedural examination: Secondary | ICD-10-CM | POA: Diagnosis not present

## 2023-11-12 DIAGNOSIS — R0609 Other forms of dyspnea: Secondary | ICD-10-CM | POA: Diagnosis not present

## 2023-11-12 DIAGNOSIS — Z01810 Encounter for preprocedural cardiovascular examination: Secondary | ICD-10-CM

## 2023-11-12 HISTORY — DX: Encounter for preprocedural cardiovascular examination: Z01.810

## 2023-11-12 HISTORY — DX: Other forms of dyspnea: R06.09

## 2023-11-12 HISTORY — DX: Abnormal electrocardiogram (ECG) (EKG): R94.31

## 2023-11-12 NOTE — Telephone Encounter (Signed)
   Pre-operative Risk Assessment    Patient Name: Michelle Serrano  DOB: March 01, 1971 MRN: [id number]   Date of last office visit: 11/12/2023 Date of next office visit: PRN   Request for Surgical Clearance Procedure:   Skin removal Bilateral arms  Date of Surgery:  Clearance 12/23/23                                 Surgeon:  Dr. Tabitha Ewings Surgeon's Group or Practice Name:  [facility] Phone number:  [phone] Fax number:  [phone]   Type of Clearance Requested:   - Medical  Type of Anesthesia:  Not Indicated Additional requests/questions:   Please fax previous ECG, labS, Xray(chest) and Stress Test results to surgeon's office along with clearance STATUS.   Signed, Roni Scow M Kimball Appleby   11/12/2023, 11:39 AM

## 2023-11-12 NOTE — Telephone Encounter (Signed)
 Patient is currently seeing Dr. Krasowski in the office today, will defer cardiac clearance to Dr. Krasowski

## 2023-11-12 NOTE — Patient Instructions (Signed)
 Medication Instructions:  Your physician recommends that you continue on your current medications as directed. Please refer to the Current Medication list given to you today.  *If you need a refill on your cardiac medications before your next appointment, please call your pharmacy*   Lab Work: CBC, CMP, PT, PTT, - to be drawn at any [facility].   Testing/Procedures:  A chest x-ray takes a picture of the organs and structures inside the chest, including the heart, lungs, and blood vessels. This test can show several things, including, whether the heart is enlarges; whether fluid is building up in the lungs; and whether pacemaker / defibrillator leads are still in place.       Stress Echocardiogram Information Sheet                                                      Instructions:    1. You may take your morning medications the morning of the test  2. Light breakfast.  3. Dress prepared to exercise.  4. DO NOT use ANY caffeine or tobacco products 3 hours before appointment.  5. Please bring all current prescription medications.    Follow-Up: At [facility], you and your health needs are our priority.  As part of our continuing mission to provide you with exceptional heart care, we have created designated Provider Care Teams.  These Care Teams include your primary Cardiologist (physician) and Advanced Practice Providers (APPs -  Physician Assistants and Nurse Practitioners) who all work together to provide you with the care you need, when you need it.  We recommend signing up for the patient portal called "MyChart".  Sign up information is provided on this After Visit Summary.  MyChart is used to connect with patients for Virtual Visits (Telemedicine).  Patients are able to view lab/test results, encounter notes, upcoming appointments, etc.  Non-urgent messages can be sent to your provider as well.   To learn more about what you can do with MyChart, go to [email or web address].     Your next appointment:   Follow up as needed  The format for your next appointment:   In Person  Provider:   Ralene Burger, MD   Other Instructions Exercise Stress Echocardiogram An exercise stress echocardiogram is a test to check how well your heart is working. This test uses sound waves and a computer to make pictures of your heart. These pictures will be taken before and after you exercise. For this test, you will walk on a treadmill or ride a bicycle to make your heart beat faster. While you exercise, your heart will be checked with an electrocardiogram (ECG). Your blood pressure will also be checked. You may have this test if: You have chest pain or a heart problem. You had a heart attack or heart surgery not long ago. You have heart valve problems. You have a condition that causes narrowing of the blood vessels that supply your heart. You have a high risk of heart disease and: You are starting a new exercise program. You need to have a big surgery. Tell a doctor about: Any allergies you have. All medicines you are taking. This includes vitamins, herbs, eye drops, creams, and over-the-counter medicines. Any problems you or family members have had with medicines that make you fall asleep (anesthetic medicines). Any surgeries you have had. Any  blood disorders you have. Any medical conditions you have. Whether you are pregnant or may be pregnant. What are the risks? Generally, this is a safe test. However, problems may occur, including: Chest pain. Feeling dizzy or light-headed. Shortness of breath. Increased or irregular heartbeat. Feeling like you may vomit (nausea) or vomiting. Heart attack. This is very rare. What happens before the test? Medicines Ask your doctor about changing or stopping your normal medicines. This is important if you take diabetes medicines or blood thinners. If you use an inhaler, bring it to the test. General instructions Wear  comfortable clothes and walking shoes. Follow instructions from your doctor about what you cannot eat or drink before the test. Do not drink or eat anything that has caffeine in it. Stop having caffeine 24 hours before the test. Do not smoke or use products that contain nicotine or tobacco for 4 hours before the test. If you need help quitting, ask your doctor. What happens during the test?  You will take off your clothes from the waist up and put on a hospital gown. Electrodes or patches will be put on your chest. A blood pressure cuff will be put on your arm. Before you exercise, a computer will make a picture of your heart. To do this: You will lie down and a gel will be put on your chest. A wand will be moved over the gel. Sound waves from the wand will go to the computer to make the picture. Then, you will start to exercise. You may walk on a treadmill or pedal a bicycle. Your blood pressure and heart rhythm will be checked while you exercise. The exercise will get harder or faster. You will exercise until: Your heart reaches a certain level. You are too tired to go on. You cannot go on because of chest pain, weakness, or dizziness. You will lie down right away so another picture of your heart can be taken. The procedure may vary among doctors and hospitals. What can I expect after the test? After your test, it is common to have: Mild soreness. Mild tiredness. Your heart rate and blood pressure will be checked until they return to your normal levels. You should not have any new symptoms after this test. Follow these instructions at home: If your doctor says that you can, you may: Eat what you normally eat. Do your normal activities. Take over-the-counter and prescription medicines only as told by your doctor. Keep all follow-up visits. It is up to you to get the results of your test. Ask how to get your results when they are ready. Contact a doctor if: You feel dizzy or  light-headed. You have a fast or irregular heartbeat. You feel like you may vomit or you vomit. You have a headache. You feel short of breath. Get help right away if: You develop pain or pressure: In your chest. In your jaw or neck. Between your shoulders. That goes down your left arm. You faint. You have trouble breathing. These symptoms may be an emergency. Get medical help right away. Call your local emergency services (911 in the [place] Do not wait to see if the symptoms will go away. Do not drive yourself to the hospital. Summary This is a test that checks how well your heart is working. Follow instructions about what you cannot eat or drink before the test. Ask your doctor if you should take your normal medicines before the test. Stop having caffeine 24 hours before the test. Do not  smoke or use products with nicotine or tobacco in them for 4 hours before the test. During the test, your blood pressure and heart rhythm will be checked while you exercise. This information is not intended to replace advice given to you by your health care provider. Make sure you discuss any questions you have with your health care provider. Document Revised: 03/19/2021 Document Reviewed: 02/27/2020 Elsevier Patient Education  2022 [employer].

## 2023-11-12 NOTE — Progress Notes (Signed)
 Cardiology Consultation:    Date:  11/12/2023   ID:  Michelle Serrano, DOB Apr 23, 1971, MRN [id number]  PCP:  Michelle Memory, MD (Inactive)  Cardiologist:  Ralene Burger, MD   Referring MD: No ref. provider found   Chief Complaint  Patient presents with   Medical Clearance    12/23/2023 Dr.  Tabitha Ewings  Skin removed form both arms    History of Present Illness:    Michelle Serrano is a 53 y.o. female who is being seen today for the evaluation of cardiovascular preop evaluation at the request of No ref. provider found.  Past medical history significant for morbid obesity, she did receive Lap-Band surgery couple years ago since that time she lost tremendous amount of weight.  Now she does have excess of skin that need to be removed she scheduled to have plastic surgery for that she was sent to us  for evaluation overall she is doing fine.  She described to have shortness of breath with exertion.  At the same time she goes to gym and try to exercise on the regular basis.  No typical chest pain tightness squeezing pressure burning chest.  Her EKG showed incomplete right bundle branch block.  Her LDL is 81 HDL 43.  Past Medical History:  Diagnosis Date   Anemia    Asthma    Infertility, female    LAP-BAND surgery status 07/24/2007   Low vitamin D  level    Pneumonia 2014   Uterine fibroids affecting pregnancy     Past Surgical History:  Procedure Laterality Date   DILATION AND CURETTAGE OF UTERUS     FINGER SURGERY     rt little finger   FOOT SURGERY Left    LAPAROSCOPIC GASTRIC BANDING  07-24-2007   LAPAROTOMY N/A 10/20/2016   Procedure: EXPLORATORY LAPAROTOMY;  Surgeon: Michelle Erps, MD;  Location: [facility];  Service: Gynecology;  Laterality: N/A;  request to follow first case at around 10:30am  requests 2 hours for the case.   Bilateral ovarian cyst and possible BSO   MYOMECTOMY ABDOMINAL APPROACH  2009   [place] [facility] [place]   scar tissue  removal in uterus  2011   TONSILECTOMY, ADENOIDECTOMY, BILATERAL MYRINGOTOMY AND TUBES     TONSILLECTOMY      Current Medications: Current Meds  Medication Sig   albuterol  (PROVENTIL  HFA;VENTOLIN  HFA) 108 (90 Base) MCG/ACT inhaler Inhale 2 puffs into the lungs every 6 (six) hours as needed for wheezing or shortness of breath.   azelastine (ASTELIN) 0.1 % nasal spray Place 1-2 sprays into both nostrils as needed for allergies.   azelastine (OPTIVAR) 0.05 % ophthalmic solution Place 1 drop into both eyes as needed (allergies).   budesonide-formoterol (SYMBICORT) 80-4.5 MCG/ACT inhaler Inhale 2 puffs into the lungs 2 (two) times daily.   chlorzoxazone  (PARAFON ) 500 MG tablet Take 1 tablet (500 mg total) by mouth daily as needed for muscle spasms.   EPINEPHrine 0.3 mg/0.3 mL IJ SOAJ injection Inject 0.3 mg into the muscle as needed.   ferrous sulfate 325 (65 FE) MG EC tablet Take 325 mg by mouth 2 (two) times daily.   fluconazole  (DIFLUCAN ) 150 MG tablet Take 1 tablet (150 mg total) by mouth every 3 (three) days.   fluticasone  (FLONASE ) 50 MCG/ACT nasal spray Place 2 sprays into both nostrils daily as needed for allergies or rhinitis.   levocetirizine (XYZAL ) 5 MG tablet Take 5 mg by mouth every evening.   levonorgestrel -ethinyl estradiol (LARISSIA) 0.1-20  MG-MCG tablet Take 1 tablet by mouth daily. Take ACTIVE pills only, skipping PLACEBO pills   montelukast (SINGULAIR) 10 MG tablet Take 10 mg by mouth at bedtime.   predniSONE (DELTASONE) 20 MG tablet Take 20 mg by mouth 2 (two) times daily.   promethazine -dextromethorphan (PROMETHAZINE -DM) 6.25-15 MG/5ML syrup Take 1.25 mLs by mouth 4 (four) times daily as needed for cough.   SYMBICORT 160-4.5 MCG/ACT inhaler Inhale 2 puffs into the lungs 2 (two) times daily.   tirzepatide (ZEPBOUND) 7.5 MG/0.5ML Pen Inject 7.5 mg into the skin once a week.   ZEPBOUND 10 MG/0.5ML Pen Inject 10 mg into the skin once a week.   ZEPBOUND 15 MG/0.5ML Pen Inject 15  mg into the skin once a week.     Allergies:   Aspirin, Latex, Other, and Shellfish allergy   Social History   Socioeconomic History   Marital status: Married    Spouse name: Not on file   Number of children: Not on file   Years of education: Not on file   Highest education level: Not on file  Occupational History   Not on file  Tobacco Use   Smoking status: Never   Smokeless tobacco: Never  Vaping Use   Vaping status: Never Used  Substance and Sexual Activity   Alcohol use: No   Drug use: No   Sexual activity: Yes    Partners: Male    Birth control/protection: OCP    Comment: intercourse age 64, less than 5 sexual partners, married 16 yrs  Other Topics Concern   Not on file  Social History Narrative   Not on file   Social Drivers of Health   Financial Resource Strain: Not on file  Food Insecurity: Not on file  Transportation Needs: Not on file  Physical Activity: Not on file  Stress: Not on file  Social Connections: Not on file     Family History: The patient's family history includes Breast cancer in her maternal grandmother; Cancer (age of onset: 91) in her mother; Diabetes in her maternal grandmother. There is no history of Anesthesia problems. ROS:   Please see the history of present illness.    All 14 point review of systems negative except as described per history of present illness.  EKGs/Labs/Other Studies Reviewed:    The following studies were reviewed today:   EKG:  EKG Interpretation Date/Time:  Friday November 12 2023 11:08:42 EDT Ventricular Rate:  82 PR Interval:  128 QRS Duration:  106 QT Interval:  354 QTC Calculation: 413 R Axis:   -27  Text Interpretation: Normal sinus rhythm with sinus arrhythmia Incomplete right bundle branch block Borderline ECG When compared with ECG of 29-Sep-2006 08:38, QRS axis Shifted left Confirmed by Ralene Burger [phone]) on 11/12/2023 11:11:21 AM    Recent Labs: No results found for requested labs within  last 365 days.  Recent Lipid Panel    Component Value Date/Time   CHOL 140 02/23/2022 1524   TRIG 52 02/23/2022 1524   HDL 52 02/23/2022 1524   CHOLHDL 2.7 02/23/2022 1524   VLDL 12 09/07/2016 1135   LDLCALC 75 02/23/2022 1524    Physical Exam:    VS:  BP (!) 130/90 (BP Location: Left Arm, Patient Position: Sitting)   Pulse 82   Ht 5\' 7"  (1.702 m)   Wt 176 lb 6.4 oz (80 kg)   BMI 27.63 kg/m     Wt Readings from Last 3 Encounters:  11/12/23 176 lb 6.4 oz (80 kg)  03/16/23 233 lb (105.7 kg)  02/23/22 230 lb (104.3 kg)     GEN:  Well nourished, well developed in no acute distress HEENT: Normal NECK: No JVD; No carotid bruits LYMPHATICS: No lymphadenopathy CARDIAC: RRR, no murmurs, no rubs, no gallops RESPIRATORY:  Clear to auscultation without rales, wheezing or rhonchi  ABDOMEN: Soft, non-tender, non-distended MUSCULOSKELETAL:  No edema; No deformity  SKIN: Warm and dry NEUROLOGIC:  Alert and oriented x 3 PSYCHIATRIC:  Normal affect   ASSESSMENT:    1. Pre-op evaluation   2. Preop cardiovascular exam   3. Dyspnea on exertion   4. Nonspecific abnormal electrocardiogram (ECG) (EKG)    PLAN:    In order of problems listed above:  Cardiovascular preop evaluation for this lady for plastic surgery.  She does have dyspnea on exertion.  Does have some uneasy sensation in the chest but nothing dramatic.  I think it would be reasonable to perform stress echocardiogram make sure that she does not have any inducible ischemia. Right bundle branch block incomplete.  I suspect this is result of her obesity we will do stress echocardiogram we will try to look at the pulmonary pressure. Cholesterol status is known and well-controlled continue present management.  If her stress test is negative she would be acceptable candidate for surgery    Medication Adjustments/Labs and Tests Ordered: Current medicines are reviewed at length with the patient today.  Concerns regarding  medicines are outlined above.  Orders Placed This Encounter  Procedures   DG Chest 2 View   CBC   Comprehensive metabolic panel with GFR   Protime-INR   PTT   EKG 12-Lead   ECHOCARDIOGRAM STRESS TEST   No orders of the defined types were placed in this encounter.   Signed, Manfred Seed, MD, [facility]. 11/12/2023 12:01 PM    [facility]

## 2023-11-19 NOTE — Addendum Note (Signed)
 Addended by: Shawnee Dellen D on: 11/19/2023 01:46 PM   Modules accepted: Orders

## 2023-11-22 ENCOUNTER — Ambulatory Visit: Admitting: Cardiovascular Disease

## 2023-11-26 NOTE — Addendum Note (Signed)
 Addended by: Ralene Burger on: 11/26/2023 11:54 AM   Modules accepted: Orders

## 2023-12-03 ENCOUNTER — Telehealth (HOSPITAL_COMMUNITY): Payer: Self-pay | Admitting: *Deleted

## 2023-12-03 NOTE — Telephone Encounter (Signed)
 Patient given detailed instructions per Stress Test Requisition Sheet for test on 12/10/2023 at 1:45.Patient Notified to arrive 30 minutes early, and that it is imperative to arrive on time for appointment to keep from having the test rescheduled.  Patient verbalized understanding. Michelle Serrano

## 2023-12-10 ENCOUNTER — Other Ambulatory Visit: Payer: Self-pay | Admitting: Cardiology

## 2023-12-10 ENCOUNTER — Telehealth: Payer: Self-pay | Admitting: Cardiology

## 2023-12-10 ENCOUNTER — Ambulatory Visit (HOSPITAL_COMMUNITY)
Admission: RE | Admit: 2023-12-10 | Discharge: 2023-12-10 | Disposition: A | Discharge status: Home / Self Care | Source: Ambulatory Visit | Attending: Cardiology | Admitting: Cardiology

## 2023-12-10 ENCOUNTER — Telehealth: Payer: Self-pay

## 2023-12-10 DIAGNOSIS — R9431 Abnormal electrocardiogram [ECG] [EKG]: Secondary | ICD-10-CM

## 2023-12-10 DIAGNOSIS — I361 Nonrheumatic tricuspid (valve) insufficiency: Secondary | ICD-10-CM

## 2023-12-10 DIAGNOSIS — Z01810 Encounter for preprocedural cardiovascular examination: Secondary | ICD-10-CM | POA: Diagnosis not present

## 2023-12-10 DIAGNOSIS — Z01818 Encounter for other preprocedural examination: Secondary | ICD-10-CM

## 2023-12-10 DIAGNOSIS — I517 Cardiomegaly: Secondary | ICD-10-CM | POA: Diagnosis not present

## 2023-12-10 DIAGNOSIS — R0609 Other forms of dyspnea: Secondary | ICD-10-CM

## 2023-12-10 LAB — ECHOCARDIOGRAM COMPLETE
Area-P 1/2: 3.37 cm2
Calc EF: 49.3 %
S' Lateral: 3.7 cm
Single Plane A2C EF: 45.8 %
Single Plane A4C EF: 49.1 %

## 2023-12-10 NOTE — Telephone Encounter (Signed)
 Patient went in for stress test but patient says they decided not to do it and recommended she have an EKG. She would like to set up an appointment for an EKG. Please assist.

## 2023-12-10 NOTE — Telephone Encounter (Signed)
 Left message for patient that we would call her to schedule the CT angio as per Dr. Tonja Fray request. Encouraged her to call with any questions.

## 2023-12-11 ENCOUNTER — Ambulatory Visit (HOSPITAL_COMMUNITY)
Admission: RE | Admit: 2023-12-11 | Discharge: 2023-12-11 | Disposition: A | Discharge status: Home / Self Care | Source: Ambulatory Visit | Attending: Cardiology | Admitting: Cardiology

## 2023-12-11 DIAGNOSIS — Z01818 Encounter for other preprocedural examination: Secondary | ICD-10-CM | POA: Insufficient documentation

## 2023-12-11 LAB — COMPREHENSIVE METABOLIC PANEL WITH GFR
ALT: 12 IU/L (ref 0–32)
AST: 13 IU/L (ref 0–40)
Albumin: 4.1 g/dL (ref 3.9–4.9)
Alkaline Phosphatase: 56 IU/L (ref 44–121)
BUN/Creatinine Ratio: 9 (ref 9–23)
BUN: 8 mg/dL (ref 6–24)
Bilirubin Total: 0.5 mg/dL (ref 0.0–1.2)
CO2: 19 mmol/L — ABNORMAL LOW (ref 20–29)
Calcium: 9.1 mg/dL (ref 8.7–10.2)
Chloride: 104 mmol/L (ref 96–106)
Creatinine, Ser: 0.91 mg/dL (ref 0.57–1.00)
Globulin, Total: 2.6 g/dL (ref 1.5–4.5)
Glucose: 81 mg/dL (ref 70–99)
Potassium: 4.1 mmol/L (ref 3.5–5.2)
Sodium: 139 mmol/L (ref 134–144)
Total Protein: 6.7 g/dL (ref 6.0–8.5)
eGFR: 77 mL/min/{1.73_m2} (ref >59)

## 2023-12-11 LAB — CBC
Hematocrit: 36 % (ref 34.0–46.6)
Hemoglobin: 11.6 g/dL (ref 11.1–15.9)
MCH: 28.9 pg (ref 26.6–33.0)
MCHC: 32.2 g/dL (ref 31.5–35.7)
MCV: 90 fL (ref 79–97)
Platelets: 399 10*3/uL (ref 150–450)
RBC: 4.02 x10E6/uL (ref 3.77–5.28)
RDW: 13.3 % (ref 11.7–15.4)
WBC: 6.1 10*3/uL (ref 3.4–10.8)

## 2023-12-11 LAB — APTT: aPTT: 24 s (ref 24–33)

## 2023-12-14 ENCOUNTER — Telehealth: Payer: Self-pay

## 2023-12-14 ENCOUNTER — Telehealth (HOSPITAL_COMMUNITY): Payer: Self-pay | Admitting: *Deleted

## 2023-12-14 DIAGNOSIS — Z01818 Encounter for other preprocedural examination: Secondary | ICD-10-CM

## 2023-12-14 MED ORDER — METOPROLOL TARTRATE 100 MG PO TABS: ORAL_TABLET | ORAL | 0 refills | Status: DC

## 2023-12-14 NOTE — Telephone Encounter (Signed)
 Called the patient and she was asking about the reason why the doctor's opted not to do the stress part of the stress echocardiogram and to proceed with a Cardiac CT. Spoke with Dr. Krasowski and he stated that the reason for proceeding with a Cardiac CT is that the test gives us  much more information regarding the plaque build up in her coronary arteries. I relayed this information to the patient and she verbalized understanding and had no further questions at this time.

## 2023-12-14 NOTE — Telephone Encounter (Signed)
 Take: Metoprolol 100mg  1 tablet 2 hours prior to CT Scan    Your cardiac CT will be scheduled at one of the below locations:    Jeralene Mom. [facility] [address]  [place], [place] [address] Opening November 15, 2023       If scheduled at the Heart and Vascular Tower at [employer], please enter the parking lot using the [address] street entrance and use the FREE valet service at the patient drop-off area. Enter the buidling and check-in with registration on the main floor.  Please follow these instructions carefully (unless otherwise directed):  An IV will be required for this test and Nitroglycerin will be given.    On the Night Before the Test: Be sure to Drink plenty of water. Do not consume any caffeinated/decaffeinated beverages or chocolate 12 hours prior to your test. Do not take any antihistamines 12 hours prior to your test.   On the Day of the Test: Drink plenty of water until 1 hour prior to the test. Do not eat any food 1 hour prior to test. You may take your regular medications prior to the test.  Take metoprolol (Lopressor) two hours prior to test. Patients who wear a continuous glucose monitor MUST remove the device prior to scanning. FEMALES- please wear underwire-free bra if available, avoid dresses & tight clothing  *For Clinical Staff only. Please instruct patient the following:*       After the Test: Drink plenty of water. After receiving IV contrast, you may experience a mild flushed feeling. This is normal. On occasion, you may experience a mild rash up to 24 hours after the test. This is not dangerous. If this occurs, you can take Benadryl  25 mg, Zyrtec, Claritin , or Allegra and increase your fluid intake. (Patients taking Tikosyn should avoid Benadryl , and may take Zyrtec, Claritin , or Allegra) If you experience trouble breathing, this can be serious. If it is severe call 911 IMMEDIATELY. If it is mild, please call our  office.  We will call to schedule your test 2-4 weeks out understanding that some insurance companies will need an authorization prior to the service being performed.   For more information and frequently asked questions, please visit our website : [email or web address]  For non-scheduling related questions, please contact the cardiac imaging nurse navigator should you have any questions/concerns: Cardiac Imaging Nurse Navigators Direct Office Dial: [phone]   For scheduling needs, including cancellations and rescheduling, please call [place], [phone].

## 2023-12-14 NOTE — Telephone Encounter (Signed)
 Attempted to call patient regarding upcoming cardiac CT appointment. Left message on voicemail with name and callback number  Larey Brick RN Navigator Cardiac Imaging [facility] [phone] Office [phone] Cell

## 2023-12-14 NOTE — Telephone Encounter (Signed)
 Pt calling back stating she forgot to ask you a question

## 2023-12-15 ENCOUNTER — Ambulatory Visit (HOSPITAL_COMMUNITY)
Admission: RE | Admit: 2023-12-15 | Discharge: 2023-12-15 | Disposition: A | Discharge status: Home / Self Care | Source: Ambulatory Visit | Attending: Cardiology | Admitting: Cardiology

## 2023-12-15 DIAGNOSIS — Z01810 Encounter for preprocedural cardiovascular examination: Secondary | ICD-10-CM | POA: Diagnosis not present

## 2023-12-15 DIAGNOSIS — Z01818 Encounter for other preprocedural examination: Secondary | ICD-10-CM | POA: Diagnosis not present

## 2023-12-15 DIAGNOSIS — R9430 Abnormal result of cardiovascular function study, unspecified: Secondary | ICD-10-CM | POA: Diagnosis not present

## 2023-12-15 DIAGNOSIS — R079 Chest pain, unspecified: Secondary | ICD-10-CM | POA: Diagnosis not present

## 2023-12-15 MED ORDER — NITROGLYCERIN 0.4 MG SL SUBL
SUBLINGUAL_TABLET | SUBLINGUAL | Status: AC
  Filled 2023-12-15: qty 2

## 2023-12-15 MED ORDER — NITROGLYCERIN 0.4 MG SL SUBL
0.8000 mg | SUBLINGUAL_TABLET | Freq: Once | SUBLINGUAL | Status: AC
  Administered 2023-12-15: 0.8 mg via SUBLINGUAL

## 2023-12-15 MED ORDER — IOHEXOL 350 MG/ML SOLN
100.0000 mL | Freq: Once | INTRAVENOUS | Status: AC | PRN
  Administered 2023-12-15: 100 mL via INTRAVENOUS

## 2023-12-16 ENCOUNTER — Ambulatory Visit: Payer: Self-pay | Admitting: Cardiology

## 2023-12-16 ENCOUNTER — Encounter: Payer: Self-pay | Admitting: Cardiology

## 2023-12-16 ENCOUNTER — Telehealth: Payer: Self-pay | Admitting: Cardiology

## 2023-12-16 NOTE — Telephone Encounter (Signed)
 Spoke with pt who states that she needs her clearance as soon as possible due to her surgery being 12/23/23.

## 2023-12-16 NOTE — Telephone Encounter (Signed)
 I am not the one who has to finalize the clearance. The doctor and the preop PA are working on this for you. Once this has been taken care of we will send notes to the surgeon.

## 2023-12-16 NOTE — Telephone Encounter (Signed)
 Patient stated she had her echocardiogram yesterday and has some questions and wants a call back directly from RN Dede.

## 2023-12-16 NOTE — Telephone Encounter (Signed)
 Pt called back in about clearance. Michelle Serrano with [facility] clearance department was also on the line. She states the Anesthesia type is General. She also asked that the clearance specify that pt does not need stress test anymore because CT was done instead. She asked if you could fax it over and send a message to patient mychart so she is aware that is complete.    Fax number:  [phone]

## 2023-12-17 DIAGNOSIS — Z01818 Encounter for other preprocedural examination: Secondary | ICD-10-CM | POA: Diagnosis not present

## 2023-12-17 NOTE — Telephone Encounter (Signed)
   Patient Name: Michelle Serrano  DOB: 15-Aug-1970 MRN: [id number]  Primary Cardiologist: Gordan Latina   Chart reviewed as part of pre-operative protocol coverage. Given past medical history and time since last visit, based on ACC/AHA guidelines, Jakyla Reza is at acceptable risk for the planned procedure without further cardiovascular testing. She had cardiac CT on 12/15/2023 showing normal coronary arteries. Echo on 12/10/2023 revealed slightly reduced EF of 45%-50%. She is cleared for surgery. No cardiac meds should be held. GLP 1 is recommended to be held one week prior to surgery per anesthesia protocol.   The patient was advised that if she develops new symptoms prior to surgery to contact our office to arrange for a follow-up visit, and she verbalized understanding.  I will route this recommendation to the requesting party via Epic fax function and remove from pre-op pool.  Please call with questions.  Friddie Jetty, NP 12/17/2023, 8:04 AM

## 2023-12-17 NOTE — Telephone Encounter (Signed)
 Sent patient a Clinical cytogeneticist message stating that we faxed the clearance over this morning and Dr. Gordan Latina read her CT and stated below Coronary CT angio normal however echocardiogram showed mildly reduced left ventricular ejection fraction, so she need to put on appropriate medications lets get her to see me within the next week or so  I offered patient appointment with him on 12/20/23 at 9:00 am.

## 2023-12-17 NOTE — Telephone Encounter (Signed)
Pt is calling to check on the status of this message °

## 2023-12-20 ENCOUNTER — Other Ambulatory Visit: Payer: Self-pay

## 2023-12-20 ENCOUNTER — Ambulatory Visit: Attending: Cardiology | Admitting: Cardiology

## 2023-12-20 VITALS — BP 128/80 | HR 70 | Ht 67.0 in | Wt 173.0 lb

## 2023-12-20 DIAGNOSIS — R7989 Other specified abnormal findings of blood chemistry: Secondary | ICD-10-CM | POA: Insufficient documentation

## 2023-12-20 DIAGNOSIS — I420 Dilated cardiomyopathy: Secondary | ICD-10-CM

## 2023-12-20 DIAGNOSIS — D649 Anemia, unspecified: Secondary | ICD-10-CM | POA: Insufficient documentation

## 2023-12-20 DIAGNOSIS — R0609 Other forms of dyspnea: Secondary | ICD-10-CM

## 2023-12-20 DIAGNOSIS — O3410 Maternal care for benign tumor of corpus uteri, unspecified trimester: Secondary | ICD-10-CM | POA: Insufficient documentation

## 2023-12-20 DIAGNOSIS — N979 Female infertility, unspecified: Secondary | ICD-10-CM | POA: Insufficient documentation

## 2023-12-20 DIAGNOSIS — Z01810 Encounter for preprocedural cardiovascular examination: Secondary | ICD-10-CM | POA: Diagnosis not present

## 2023-12-20 HISTORY — DX: Dilated cardiomyopathy: I42.0

## 2023-12-20 NOTE — Patient Instructions (Signed)
 Medication Instructions:  Your physician recommends that you continue on your current medications as directed. Please refer to the Current Medication list given to you today.  *If you need a refill on your cardiac medications before your next appointment, please call your pharmacy*  Lab Work: None If you have labs (blood work) drawn today and your tests are completely normal, you will receive your results only by: MyChart Message (if you have MyChart) OR A paper copy in the mail If you have any lab test that is abnormal or we need to change your treatment, we will call you to review the results.  Testing/Procedures: Your physician has requested that you have an echocardiogram. Echocardiography is a painless test that uses sound waves to create images of your heart. It provides your doctor with information about the size and shape of your heart and how well your heart's chambers and valves are working. This procedure takes approximately one hour. There are no restrictions for this procedure. Please do NOT wear cologne, perfume, aftershave, or lotions (deodorant is allowed). Please arrive 15 minutes prior to your appointment time.  Please note: We ask at that you not bring children with you during ultrasound (echo/ vascular) testing. Due to room size and safety concerns, children are not allowed in the ultrasound rooms during exams. Our front office staff cannot provide observation of children in our lobby area while testing is being conducted. An adult accompanying a patient to their appointment will only be allowed in the ultrasound room at the discretion of the ultrasound technician under special circumstances. We apologize for any inconvenience.   Follow-Up: At [facility], you and your health needs are our priority.  As part of our continuing mission to provide you with exceptional heart care, our providers are all part of one team.  This team includes your primary Cardiologist  (physician) and Advanced Practice Providers or APPs (Physician Assistants and Nurse Practitioners) who all work together to provide you with the care you need, when you need it.  Your next appointment:   3 month(s)  Provider:   Ralene Burger, MD    We recommend signing up for the patient portal called "MyChart".  Sign up information is provided on this After Visit Summary.  MyChart is used to connect with patients for Virtual Visits (Telemedicine).  Patients are able to view lab/test results, encounter notes, upcoming appointments, etc.  Non-urgent messages can be sent to your provider as well.   To learn more about what you can do with MyChart, go to [email or web address].   Other Instructions None

## 2023-12-20 NOTE — Progress Notes (Unsigned)
 Cardiology Office Note:    Date:  12/20/2023   ID:  Michelle Serrano, DOB 02-12-1971, MRN [id number]  PCP:  Jannelle Memory, MD (Inactive)  Cardiologist:  Ralene Burger, MD    Referring MD: No ref. provider found   No chief complaint on file.   History of Present Illness:    Michelle Serrano is a 53 y.o. female past medical history significant for morbid obesity she did have lump found surgery after bed close tremendous amount of weight she does have excessive skin on her arm and wanted to have surgery she was sent to us  for evaluation.  She is overall doing very well.  She can go to gym and exercise gets short of breath but some atypical chest pain as well.  Evaluation included coronary CT angio which showed calcium score 0, coronary arteries were normal.  She also had echocardiogram done echocardiogram showed mildly reduced left ventricle ejection fraction 45 to 50%.  She comes to my office to talk about it.  Overall she is doing well.  Denies have any chest pain tightness squeezing pressure burning chest still exercise at the gym  Past Medical History:  Diagnosis Date   Adnexal mass 11/24/2016   Anemia    Dyspnea on exertion 11/12/2023   Infertility, female    LAP-BAND surgery status 07/24/2007   Low vitamin D  level    Nonspecific abnormal electrocardiogram (ECG) (EKG) 11/12/2023   Pneumonia 2014   Postoperative state 10/20/2016   Preop cardiovascular exam 11/12/2023   Uterine fibroids affecting pregnancy    Uterine leiomyoma 09/07/2016   Vitamin D  deficiency 11/11/2023    Past Surgical History:  Procedure Laterality Date   DILATION AND CURETTAGE OF UTERUS     FINGER SURGERY     rt little finger   FOOT SURGERY Left    LAPAROSCOPIC GASTRIC BANDING  07-24-2007   LAPAROTOMY N/A 10/20/2016   Procedure: EXPLORATORY LAPAROTOMY;  Surgeon: Davia Erps, MD;  Location: [facility];  Service: Gynecology;  Laterality: N/A;  request to follow first case at around  10:30am  requests 2 hours for the case.   Bilateral ovarian cyst and possible BSO   MYOMECTOMY ABDOMINAL APPROACH  2009   [place] - [place]   scar tissue removal in uterus  2011   TONSILECTOMY, ADENOIDECTOMY, BILATERAL MYRINGOTOMY AND TUBES     TONSILLECTOMY      Current Medications: Current Meds  Medication Sig   albuterol  (PROVENTIL  HFA;VENTOLIN  HFA) 108 (90 Base) MCG/ACT inhaler Inhale 2 puffs into the lungs every 6 (six) hours as needed for wheezing or shortness of breath.   azelastine (ASTELIN) 0.1 % nasal spray Place 1-2 sprays into both nostrils as needed for allergies.   azelastine (OPTIVAR) 0.05 % ophthalmic solution Place 1 drop into both eyes as needed (allergies).   budesonide-formoterol (SYMBICORT) 80-4.5 MCG/ACT inhaler Inhale 2 puffs into the lungs 2 (two) times daily.   chlorzoxazone  (PARAFON ) 500 MG tablet Take 1 tablet (500 mg total) by mouth daily as needed for muscle spasms.   EPINEPHrine 0.3 mg/0.3 mL IJ SOAJ injection Inject 0.3 mg into the muscle as needed for anaphylaxis.   ferrous sulfate 325 (65 FE) MG EC tablet Take 325 mg by mouth 2 (two) times daily.   fluticasone  (FLONASE ) 50 MCG/ACT nasal spray Place 2 sprays into both nostrils daily as needed for allergies or rhinitis.   levocetirizine (XYZAL ) 5 MG tablet Take 5 mg by mouth every evening.   levonorgestrel -ethinyl estradiol (LARISSIA) 0.1-20  MG-MCG tablet Take 1 tablet by mouth daily. Take ACTIVE pills only, skipping PLACEBO pills   montelukast (SINGULAIR) 10 MG tablet Take 10 mg by mouth at bedtime.   predniSONE (DELTASONE) 20 MG tablet Take 20 mg by mouth 2 (two) times daily.   promethazine -dextromethorphan (PROMETHAZINE -DM) 6.25-15 MG/5ML syrup Take 1.25 mLs by mouth 4 (four) times daily as needed for cough.   ZEPBOUND 15 MG/0.5ML Pen Inject 15 mg into the skin once a week.     Allergies:   Aspirin, Latex, Other, and Shellfish allergy   Social History   Socioeconomic History   Marital  status: Married    Spouse name: Not on file   Number of children: Not on file   Years of education: Not on file   Highest education level: Not on file  Occupational History   Not on file  Tobacco Use   Smoking status: Never   Smokeless tobacco: Never  Vaping Use   Vaping status: Never Used  Substance and Sexual Activity   Alcohol use: No   Drug use: No   Sexual activity: Yes    Partners: Male    Birth control/protection: OCP    Comment: intercourse age 39, less than 5 sexual partners, married 16 yrs  Other Topics Concern   Not on file  Social History Narrative   Not on file   Social Drivers of Health   Financial Resource Strain: Not on file  Food Insecurity: Not on file  Transportation Needs: Not on file  Physical Activity: Not on file  Stress: Not on file  Social Connections: Not on file     Family History: The patient's family history includes Breast cancer in her maternal grandmother; Cancer (age of onset: 30) in her mother; Diabetes in her maternal grandmother. There is no history of Anesthesia problems. ROS:   Please see the history of present illness.    All 14 point review of systems negative except as described per history of present illness  EKGs/Labs/Other Studies Reviewed:         Recent Labs: 12/10/2023: ALT 12; BUN 8; Creatinine, Ser 0.91; Hemoglobin 11.6; Platelets 399; Potassium 4.1; Sodium 139  Recent Lipid Panel    Component Value Date/Time   CHOL 140 02/23/2022 1524   TRIG 52 02/23/2022 1524   HDL 52 02/23/2022 1524   CHOLHDL 2.7 02/23/2022 1524   VLDL 12 09/07/2016 1135   LDLCALC 75 02/23/2022 1524    Physical Exam:    VS:  BP 128/80   Pulse 70   Ht 5\' 7"  (1.702 m)   Wt 173 lb (78.5 kg)   BMI 27.10 kg/m     Wt Readings from Last 3 Encounters:  12/20/23 173 lb (78.5 kg)  11/12/23 176 lb 6.4 oz (80 kg)  03/16/23 233 lb (105.7 kg)     GEN:  Well nourished, well developed in no acute distress HEENT: Normal NECK: No JVD; No  carotid bruits LYMPHATICS: No lymphadenopathy CARDIAC: RRR, no murmurs, no rubs, no gallops RESPIRATORY:  Clear to auscultation without rales, wheezing or rhonchi  ABDOMEN: Soft, non-tender, non-distended MUSCULOSKELETAL:  No edema; No deformity  SKIN: Warm and dry LOWER EXTREMITIES: no swelling NEUROLOGIC:  Alert and oriented x 3 PSYCHIATRIC:  Normal affect   ASSESSMENT:    1. Dilated cardiomyopathy (HCC)   2. Dyspnea on exertion   3. Preop cardiovascular exam    PLAN:    In order of problems listed above:  Cardiomyopathy ejection fraction 45 to 50% surprising  finding we initiated conversation about potentially taking some medication for this like Entresto.  She is very reluctant.  She said she want to think about it.  I told her if she decided Entresto 2426 will be started and Chem-7 will be done next week.  If not we will repeat her echocardiogram in about 3 months to see if there is any changes in ejection fraction if there is deterioration would push harder towards medical therapy. Dyspnea exertion stable I told her if she start having more shortness of breath or if she starts having shortness of breath with swelling of lower extremities she needs to let me know. Cardiovascular preop evaluation.  She decided to put surgery on hold until we have this issue resolved   Medication Adjustments/Labs and Tests Ordered: Current medicines are reviewed at length with the patient today.  Concerns regarding medicines are outlined above.  No orders of the defined types were placed in this encounter.  Medication changes: No orders of the defined types were placed in this encounter.   Signed, Manfred Seed, MD, [facility] 12/20/2023 9:37 AM    [facility]

## 2023-12-21 ENCOUNTER — Telehealth: Payer: Self-pay

## 2023-12-21 NOTE — Telephone Encounter (Signed)
 Left message on My Chart with CT angio Over read results per Dr. Tonja Fray note. Routed to PCP.

## 2024-03-07 ENCOUNTER — Other Ambulatory Visit: Payer: Self-pay | Admitting: Nurse Practitioner

## 2024-03-07 DIAGNOSIS — Z1231 Encounter for screening mammogram for malignant neoplasm of breast: Secondary | ICD-10-CM

## 2024-03-09 ENCOUNTER — Other Ambulatory Visit: Payer: Self-pay | Admitting: Nurse Practitioner

## 2024-03-09 DIAGNOSIS — Z3041 Encounter for surveillance of contraceptive pills: Secondary | ICD-10-CM

## 2024-03-09 NOTE — Telephone Encounter (Signed)
 Med refill request:  Vienva 0.1-20 MG-MCG tablet Last AEX:  03/16/23 Last Mammo: 02/26/23 Next AEX: scheduled for 03/21/24 Last filled: 12/20/23 - #112 tablets with 3 refills Refill authorized? Please advise.

## 2024-03-17 ENCOUNTER — Ambulatory Visit (HOSPITAL_COMMUNITY)

## 2024-03-21 ENCOUNTER — Ambulatory Visit (INDEPENDENT_AMBULATORY_CARE_PROVIDER_SITE_OTHER): Admitting: Nurse Practitioner

## 2024-03-21 ENCOUNTER — Other Ambulatory Visit (HOSPITAL_COMMUNITY)
Admission: RE | Admit: 2024-03-21 | Discharge: 2024-03-21 | Disposition: A | Discharge status: Home / Self Care | Source: Ambulatory Visit | Attending: Nurse Practitioner | Admitting: Nurse Practitioner

## 2024-03-21 ENCOUNTER — Encounter: Payer: Self-pay | Admitting: Nurse Practitioner

## 2024-03-21 VITALS — BP 120/78 | Ht 66.0 in | Wt 165.8 lb

## 2024-03-21 DIAGNOSIS — Z01419 Encounter for gynecological examination (general) (routine) without abnormal findings: Secondary | ICD-10-CM | POA: Insufficient documentation

## 2024-03-21 DIAGNOSIS — N809 Endometriosis, unspecified: Secondary | ICD-10-CM | POA: Diagnosis not present

## 2024-03-21 DIAGNOSIS — N760 Acute vaginitis: Secondary | ICD-10-CM

## 2024-03-21 DIAGNOSIS — Z124 Encounter for screening for malignant neoplasm of cervix: Secondary | ICD-10-CM

## 2024-03-21 DIAGNOSIS — Z3041 Encounter for surveillance of contraceptive pills: Secondary | ICD-10-CM

## 2024-03-21 DIAGNOSIS — Z1331 Encounter for screening for depression: Secondary | ICD-10-CM | POA: Diagnosis not present

## 2024-03-21 DIAGNOSIS — N898 Other specified noninflammatory disorders of vagina: Secondary | ICD-10-CM | POA: Diagnosis not present

## 2024-03-21 LAB — WET PREP FOR TRICH, YEAST, CLUE

## 2024-03-21 MED ORDER — LEVONORGESTREL-ETHINYL ESTRAD 0.1-20 MG-MCG PO TABS
1.0000 | ORAL_TABLET | Freq: Every day | ORAL | 3 refills | Status: DC
Start: 2024-03-21 — End: 2024-03-22

## 2024-03-21 MED ORDER — METRONIDAZOLE 500 MG PO TABS: 500.0000 mg | ORAL_TABLET | Freq: Two times a day (BID) | ORAL | 0 refills | Status: AC

## 2024-03-21 NOTE — Progress Notes (Signed)
 Michelle Serrano [facility] Oct 07, 1970 [id number]   History:  53 y.o. G2P0020 presents for annual exam. Complains of vaginal discharge. Denies odor, itching. On COCs continuously for management of severe endometriosis. Good management. Laparotomy for stage 4 endometriosis aborted in 2018 due to completely obliterated post cul-de-sac. No BTB. 2024 ASCUS neg HPV. H/O myomectomy. H/O ovarian cyst/endometrioma, has decreased in size with COCs. Last U/S in 2020. Dilated cardiomyopathy managed by cardiology.   Gynecologic History Patient's last menstrual period was 01/28/2024 (approximate). Period Duration (Days): 4 Period Pattern: (!) Irregular Menstrual Flow: Light Menstrual Control: Maxi pad Menstrual Control Change Freq (Hours): 2 Dysmenorrhea: (!) Mild Dysmenorrhea Symptoms: Cramping Contraception/Family planning: OCP (estrogen/progesterone) Sexually active: Yes  Health Maintenance Last Pap: 03/16/2023. Results were: ASCUS neg HPV Last mammogram: 02/26/2023. Results were: Normal Last colonoscopy: Never Last Dexa: Not indicated     03/21/2024    8:25 AM  Depression screen PHQ 2/9  Decreased Interest 0  Down, Depressed, Hopeless 0  PHQ - 2 Score 0     Past medical history, past surgical history, family history and social history were all reviewed and documented in the EPIC chart. Married. 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   ROS:  A ROS was performed and pertinent positives and negatives are included.  Exam:  Vitals:   03/21/24 0817  BP: 120/78  Weight: 165 lb 12.8 oz (75.2 kg)  Height: 5' 6 (1.676 m)    Body mass index is 26.76 kg/m.  General appearance:  Normal Thyroid :  Symmetrical, normal in size, without palpable masses or nodularity. Respiratory  Auscultation:  Clear without wheezing or rhonchi Cardiovascular  Auscultation:  Regular rate, without rubs, murmurs or gallops  Edema/varicosities:  Not grossly evident Abdominal  Soft,nontender,  without masses, guarding or rebound.  Liver/spleen:  No organomegaly noted  Hernia:  None appreciated  Skin  Inspection:  Grossly normal Breasts: Examined lying and sitting.   Right: Without masses, retractions, nipple discharge or axillary adenopathy.   Left: Without masses, retractions, nipple discharge or axillary adenopathy. Pelvic: External genitalia:  no lesions              Urethra:  normal appearing urethra with no masses, tenderness or lesions              Bartholins and Skenes: normal                 Vagina: + discharge, no erythema              Cervix: no lesions Bimanual Exam:  Uterus:  no masses or tenderness              Adnexa: no mass, fullness, tenderness              Rectovaginal: Deferred              Anus:  normal, no lesions  Dereck Keas, CMA present as chaperone.   Wet prep + clue cells (+ odor)  Assessment/Plan:  53 y.o. [id number] for annual exam.   Well female exam with routine gynecological exam - Education provided on SBEs, importance of preventative screenings, current guidelines, high calcium diet, regular exercise, and multivitamin daily. Labs with PCP.   Vaginal discharge - Plan: WET PREP FOR TRICH, YEAST, CLUE. Wet prep positive for clue cells.  Bacterial vaginosis - Plan: metroNIDAZOLE  (FLAGYL ) 500 MG tablet BID x 7 days.   Cervical cancer screening - Plan: Cytology - PAP( [place]). 2024 ASCUS neg HPV.  Guidelines recommend 3-year repeat but patient would like pap today.   Endometriosis - Plan: levonorgestrel -ethinyl estradiol (SRONYX) 0.1-20 MG-MCG tablet daily. Good management. Takes continuously.   Encounter for surveillance of contraceptive pills - Plan: levonorgestrel -ethinyl estradiol (SRONYX) 0.1-20 MG-MCG tablet daily.   Screening for breast cancer - Normal mammogram history.  Continue annual screenings.  Normal breast exam today.  Screening for colon cancer - Has not had colon cancer screening.   Return in about 1 year (around  03/21/2025) for Annual.     Michelle DELENA Shutter DNP, 9:05 AM 03/21/2024

## 2024-03-22 ENCOUNTER — Other Ambulatory Visit: Payer: Self-pay

## 2024-03-22 DIAGNOSIS — N809 Endometriosis, unspecified: Secondary | ICD-10-CM

## 2024-03-22 DIAGNOSIS — Z3041 Encounter for surveillance of contraceptive pills: Secondary | ICD-10-CM

## 2024-03-22 MED ORDER — LEVONORGESTREL-ETHINYL ESTRAD 0.1-20 MG-MCG PO TABS: 1.0000 | ORAL_TABLET | Freq: Every day | ORAL | 3 refills | Status: AC

## 2024-03-22 NOTE — Telephone Encounter (Signed)
.  Med refill request: Vienva  Last AEX: 03/21/24 Next AEX: Not scheduled  Last MMG (if hormonal med) scheduled for 03/23/24 Refill authorized: Please Advise?

## 2024-03-23 ENCOUNTER — Ambulatory Visit
Admission: RE | Admit: 2024-03-23 | Discharge: 2024-03-23 | Disposition: A | Discharge status: Home / Self Care | Source: Ambulatory Visit | Attending: Nurse Practitioner | Admitting: Nurse Practitioner

## 2024-03-23 DIAGNOSIS — Z1231 Encounter for screening mammogram for malignant neoplasm of breast: Secondary | ICD-10-CM

## 2024-03-23 DIAGNOSIS — J45909 Unspecified asthma, uncomplicated: Secondary | ICD-10-CM | POA: Diagnosis not present

## 2024-03-23 DIAGNOSIS — Z23 Encounter for immunization: Secondary | ICD-10-CM | POA: Diagnosis not present

## 2024-03-23 DIAGNOSIS — Z0000 Encounter for general adult medical examination without abnormal findings: Secondary | ICD-10-CM | POA: Diagnosis not present

## 2024-03-23 DIAGNOSIS — Z136 Encounter for screening for cardiovascular disorders: Secondary | ICD-10-CM | POA: Diagnosis not present

## 2024-03-23 DIAGNOSIS — E559 Vitamin D deficiency, unspecified: Secondary | ICD-10-CM | POA: Diagnosis not present

## 2024-03-23 DIAGNOSIS — M62838 Other muscle spasm: Secondary | ICD-10-CM | POA: Diagnosis not present

## 2024-03-24 ENCOUNTER — Ambulatory Visit: Payer: Self-pay | Admitting: Nurse Practitioner

## 2024-03-24 ENCOUNTER — Ambulatory Visit: Admitting: Cardiology

## 2024-03-24 LAB — CYTOLOGY - PAP
Comment: NEGATIVE
Diagnosis: UNDETERMINED — AB
High risk HPV: NEGATIVE

## 2024-05-18 DIAGNOSIS — Z01818 Encounter for other preprocedural examination: Secondary | ICD-10-CM | POA: Diagnosis not present

## 2024-05-26 DIAGNOSIS — Z23 Encounter for immunization: Secondary | ICD-10-CM | POA: Diagnosis not present

## 2024-06-12 DIAGNOSIS — J988 Other specified respiratory disorders: Secondary | ICD-10-CM | POA: Diagnosis not present
